# Patient Record
Sex: Female | Born: 1980 | Race: White | Hispanic: No | Marital: Married | Smoking: Never smoker
Health system: Southern US, Community
[De-identification: ages and names within clinical notes are randomized; demographics above are authoritative.]

## PROBLEM LIST (undated history)

## (undated) DIAGNOSIS — I480 Paroxysmal atrial fibrillation: Secondary | ICD-10-CM

## (undated) DIAGNOSIS — Z9889 Other specified postprocedural states: Secondary | ICD-10-CM

## (undated) DIAGNOSIS — I2489 Other forms of acute ischemic heart disease: Secondary | ICD-10-CM

## (undated) DIAGNOSIS — F32A Depression, unspecified: Secondary | ICD-10-CM

## (undated) DIAGNOSIS — G4730 Sleep apnea, unspecified: Secondary | ICD-10-CM

## (undated) DIAGNOSIS — I4891 Unspecified atrial fibrillation: Secondary | ICD-10-CM

## (undated) DIAGNOSIS — Z9289 Personal history of other medical treatment: Secondary | ICD-10-CM

## (undated) DIAGNOSIS — I10 Essential (primary) hypertension: Secondary | ICD-10-CM

## (undated) DIAGNOSIS — F329 Major depressive disorder, single episode, unspecified: Secondary | ICD-10-CM

## (undated) DIAGNOSIS — F419 Anxiety disorder, unspecified: Secondary | ICD-10-CM

## (undated) DIAGNOSIS — R112 Nausea with vomiting, unspecified: Secondary | ICD-10-CM

## (undated) HISTORY — DX: Other forms of acute ischemic heart disease: I24.89

## (undated) HISTORY — DX: Paroxysmal atrial fibrillation: I48.0

## (undated) HISTORY — DX: Morbid (severe) obesity due to excess calories: E66.01

## (undated) HISTORY — DX: Sleep apnea, unspecified: G47.30

## (undated) HISTORY — DX: Personal history of other medical treatment: Z92.89

---

## 2000-05-12 ENCOUNTER — Encounter: Payer: Self-pay | Admitting: Family Medicine

## 2000-05-12 ENCOUNTER — Encounter: Admission: RE | Admit: 2000-05-12 | Discharge: 2000-05-12 | Payer: Self-pay | Admitting: Family Medicine

## 2006-12-17 ENCOUNTER — Emergency Department: Payer: Self-pay | Admitting: Emergency Medicine

## 2007-02-25 ENCOUNTER — Inpatient Hospital Stay: Payer: Self-pay | Admitting: Obstetrics and Gynecology

## 2011-03-26 ENCOUNTER — Ambulatory Visit: Payer: Self-pay | Admitting: Obstetrics and Gynecology

## 2011-11-12 DIAGNOSIS — N970 Female infertility associated with anovulation: Secondary | ICD-10-CM | POA: Insufficient documentation

## 2014-01-08 ENCOUNTER — Encounter: Payer: Self-pay | Admitting: Maternal & Fetal Medicine

## 2014-07-19 ENCOUNTER — Ambulatory Visit: Payer: Self-pay | Admitting: Obstetrics and Gynecology

## 2014-07-19 LAB — CBC WITH DIFFERENTIAL/PLATELET
Basophil #: 0 10*3/uL (ref 0.0–0.1)
Basophil %: 0.1 %
Eosinophil #: 0.1 10*3/uL (ref 0.0–0.7)
Eosinophil %: 0.5 %
HCT: 38.5 % (ref 35.0–47.0)
HGB: 12.6 g/dL (ref 12.0–16.0)
Lymphocyte #: 1.5 10*3/uL (ref 1.0–3.6)
Lymphocyte %: 12.1 %
MCH: 31.1 pg (ref 26.0–34.0)
MCHC: 32.8 g/dL (ref 32.0–36.0)
MCV: 95 fL (ref 80–100)
Monocyte #: 1.2 x10 3/mm — ABNORMAL HIGH (ref 0.2–0.9)
Monocyte %: 9.6 %
Neutrophil #: 9.9 10*3/uL — ABNORMAL HIGH (ref 1.4–6.5)
Neutrophil %: 77.7 %
Platelet: 163 10*3/uL (ref 150–440)
RBC: 4.06 10*6/uL (ref 3.80–5.20)
RDW: 13.7 % (ref 11.5–14.5)
WBC: 12.8 10*3/uL — ABNORMAL HIGH (ref 3.6–11.0)

## 2014-07-20 ENCOUNTER — Inpatient Hospital Stay: Payer: Self-pay | Admitting: Obstetrics and Gynecology

## 2014-07-21 LAB — HEMATOCRIT: HCT: 33.3 % — ABNORMAL LOW (ref 35.0–47.0)

## 2014-07-23 LAB — COMPREHENSIVE METABOLIC PANEL
Albumin: 2.3 g/dL — ABNORMAL LOW (ref 3.4–5.0)
Alkaline Phosphatase: 99 U/L
Anion Gap: 9 (ref 7–16)
BUN: 6 mg/dL — ABNORMAL LOW (ref 7–18)
Bilirubin,Total: 0.2 mg/dL (ref 0.2–1.0)
Calcium, Total: 8.4 mg/dL — ABNORMAL LOW (ref 8.5–10.1)
Chloride: 109 mmol/L — ABNORMAL HIGH (ref 98–107)
Co2: 24 mmol/L (ref 21–32)
Creatinine: 0.62 mg/dL (ref 0.60–1.30)
EGFR (African American): >60
EGFR (Non-African Amer.): >60
Glucose: 148 mg/dL — ABNORMAL HIGH (ref 65–99)
Osmolality: 283 (ref 275–301)
Potassium: 3.6 mmol/L (ref 3.5–5.1)
SGOT(AST): 38 U/L — ABNORMAL HIGH (ref 15–37)
SGPT (ALT): 26 U/L
Sodium: 142 mmol/L (ref 136–145)
Total Protein: 6.4 g/dL (ref 6.4–8.2)

## 2014-07-23 LAB — CBC WITH DIFFERENTIAL/PLATELET
Basophil #: 0 x10 3/mm 3
Basophil %: 0.1 %
Eosinophil #: 0 x10 3/mm 3
Eosinophil %: 0.3 %
HCT: 36.9 %
HGB: 12 g/dL
Lymphocyte %: 6.7 %
Lymphs Abs: 0.9 x10 3/mm 3 — ABNORMAL LOW
MCH: 31.1 pg
MCHC: 32.5 g/dL
MCV: 96 fL
Monocyte #: 0.5 "x10 3/mm "
Monocyte %: 3.6 %
Neutrophil #: 11.4 x10 3/mm 3 — ABNORMAL HIGH
Neutrophil %: 89.3 %
Platelet: 173 x10 3/mm 3
RBC: 3.86 X10 6/mm 3
RDW: 14 %
WBC: 12.8 x10 3/mm 3 — ABNORMAL HIGH

## 2014-07-23 LAB — TSH: Thyroid Stimulating Horm: 1.63 u[IU]/mL

## 2014-07-23 LAB — URIC ACID: Uric Acid: 3.2 mg/dL

## 2014-07-23 LAB — FOLATE: Folic Acid: 60.6 ng/mL — ABNORMAL HIGH

## 2014-11-04 NOTE — Op Note (Signed)
PATIENT NAME:  Kelly [name], Kelly [name] MR#:  [id number] DATE OF BIRTH:  Jan 24, 1981  DATE OF PROCEDURE:  07/20/2014  PREOPERATIVE DIAGNOSIS:  1. Elective repeat cesarean section. 2. 39 +4 weeks estimated gestational age.   POSTOPERATIVE DIAGNOSES: 1. Elective repeat cesarean section. 2. 39 +4 weeks estimated gestational age.   PROCEDURES PERFORMED: 1. Elective repeat low transverse cesarean section.  2. On-Q pump placement.   ANESTHESIA: Spinal.   SURGEON: Kelly Bouchardhomas J Faven Watterson, MD   FIRST ASSISTANT: Kelly Iraniavid M. Derrill KayGoodman, MD   INDICATION FOR PROCEDURE: This is a 34 year old gravida 3, para 1, a patient with a previous C-section. EDC of 07/23/2014. The patient elects for elective repeat cesarean section.   PROCEDURE IN DETAIL: After adequate spinal anesthesia, the patient was prepped and draped in normal sterile fashion. A Pfannenstiel incision was made 2 fingerbreadths above the symphysis pubis. Sharp dissection was used to identify the fascia. The fascia was opened in the midline and opened in a transverse fashion. The superior aspect of the fascia was grasped with Kocher clamps and the recti muscles dissected free. The inferior aspect of the fascia was grasped with Kocher clamps. Pyramidalis muscle dissected free. Entry into the peritoneal cavity was accomplished sharply. The vesicouterine peritoneal fold was not identified. Therefore, a direct low transverse uterine incision was made. Upon entry into the endometrial cavity, clear fluid resulted. Excision was extended with blunt transverse traction. The fetal head was brought to the incision, and head was delivered without difficulty. Loose nuchal cord was reduced, and the shoulders and body were delivered. A vigorous female was passed to nursery staff to assign Apgar scores of 8 and 9. The placenta was manually delivered and the uterus was exteriorized. The endometrial cavity was wiped clean with laparotomy tape. Cervix was opened with ring forceps and  passed off the operative field. The uterine incision was closed with 1 chromic suture in a running nonlocking fashion with good approximation of edges. Good hemostasis was noted. One additional figure-of-eight suture was required for hemostasis. Fallopian tubes and ovaries appeared normal. Slight fimbrial adhesion to the right ovary, which was not clinically significant. The posterior cul-de-sac was irrigated and suctioned, and the uterus was placed back into the abdominal cavity. Given the initial tone of the uterus, the patient did receive 0.2 mg of Methergine while she was receiving intravenous Pitocin. The uterine incision again appeared hemostatic. The paracolic gutters were wiped clean with laparotomy tape and Interceed was placed over the uterine incision in a T-shaped fashion. The superior aspect of the fascia was grasped with Coker clamps and the On-Q pump catheters were advanced from an infraumbilical position to a subfascial position. The fascia was then closed over top of these with 0 Vicryl suture in a running nonlocking fashion with good approximation of edges. Good hemostasis was noted. Subcutaneous tissues were irrigated and bovied for hemostasis. Depth of the subcutaneous tissues approximately 3 cm; therefore, dead space was closed with a running 2-0 chromic suture. The skin was reapproximated with staples. The On-Q pump catheters were secured at the skin level with Dermabond and Steri-Stripped to the skin, and a Tegaderm was placed over top of these. Each catheter was loaded with 5 mL of 0.5% Marcaine.    COMPLICATIONS: None.   ESTIMATED BLOOD LOSS: 500 mL.   INTRAOPERATIVE FLUIDS: 600 mL.   The patient did receive 3 grams of Ancef prior to commencement of the case. The patient was taken to the recovery room in good condition.    ____________________________ Kelly Fushomas  Cloyde Reams, MD tjs:mw [name]: 07/20/2014 08:36:37 ET T: 07/20/2014 11:26:37 ET JOB#: [id number]  cc: Kelly Bouchard, MD, <Dictator> Kelly Bouchard MD ELECTRONICALLY SIGNED 07/23/2014 9:22

## 2014-11-04 NOTE — Consult Note (Signed)
Referring Physician:  Boykin Nearing   Primary Care Physician:   [facility], [address] [place], [place] [address], [phone]  Reason for Consult: Admit Date: 20-Jul-2014  Chief Complaint: inability to move  Reason for Consult: diffuse weakness   History of Present Illness: History of Present Illness:   34 yo RHD F presents to [facility] secondary to c-section for her second child.  Pt was been here for three days which have been uneventful after c-section.  Today around noon, she developed diffuse weakness in her legs and in bilateral arms as well.  She denies headache.  She denies vision changes, speech problems or neck pain.  She did have some mild back pain.  She feels hot and felt like she was going to pass out.  She felt extremely anxious and some chest pain as well.  ROS:  General weakness    HEENT no complaints    Lungs no complaints    Cardiac chest pain    GI nausea    GU no complaints    Musculoskeletal back pain    Extremities no complaints    Skin no complaints    Neuro no complaints    Endocrine no complaints    Psych anxiety    Past Medical/ Surgical Hx:  Past Medical History none   Past Surgical History none   Home Medications: Medication Instructions Last Modified Date/Time  ibuprofen 600 mg oral tablet 1 tab(s) orally every 6 hours, As needed, mild pain (1-3/10) 18-Jan-16 08:49  acetaminophen-HYDROcodone 325 mg-5 mg oral tablet 1 tab(s) orally every 3 hours, As needed, breakthrough severe pain (7-10/10) 18-Jan-16 08:49  loratadine 10 mg oral tablet 1 tab(s) orally once a day 18-Jan-16 08:48  docusate sodium 100 mg oral capsule 1 cap(s) orally 2 times a day 18-Jan-16 08:49  Prenatal Multivitamins with Folic Acid 1 mg oral tablet 1 tab(s) orally once a day 18-Jan-16 08:48  Claritin 10 mg oral tablet 1 tab(s) orally once a day 14-Jan-16 10:18   Allergies:  No Known Allergies:   Allergies:   Allergies NKDA    Social/Family History: Employment Status: currently employed  Lives With: spouse  Living Arrangements: house  Social History: no tob, no EtOh, no illicits  Family History: no seizures, no stroke   Vital Signs: **Vital Signs.:   18-Jan-16 11:48  Vital Signs Type Recheck  Temperature Temperature (F) 98.2  Celsius 36.7  Temperature Source oral  Pulse Pulse 109  Respirations Respirations 18  Systolic BP Systolic BP 267  Diastolic BP (mmHg) Diastolic BP (mmHg) 61  Mean BP 89  Pulse Ox % Pulse Ox % 100  Pulse Ox Activity Level  At rest; on L side  Oxygen Delivery Room Air/ 21 %   Physical Exam: General: obese, moderate distress, anxious and tearful  HEENT: normocephalic, sclera nonicteric, oropharynx clear  Neck: supple, no JVD, no bruits  Chest: CTA B, no wheezing  Cardiac: RRR, no murmurs, no edema, 2+ pulses  Extremities: no C/C/E, FROM   Neurologic Exam: Mental Status: alert and oriented x 3, normal speech and language, follows complex commands  Cranial Nerves: PERRLA, EOMI, nl VF, face symmetric, tongue midline, shoulder shrug equal  Motor Exam: 4/5 B but reports that L is weaker,  there is poor effort throughout exam, neg Hoover, nl tone, no atrophy  Deep Tendon Reflexes: 3+/4 B, mild Hoffman on R, mute plantars B  Sensory Exam: pinprick, temperature, and vibration intact B  Coordination: not tested   Lab Results:  Routine Hem:  16-Jan-16 05:10   Hematocrit (CBC)  33.3 (Result(s) reported on 21 Jul 2014 at [facility]   Impression/Recommendations: Recommendations:   prior notes reviewed by me reviewed by me   Diffuse weakness-  this does not appear to have a neurologic origin as it does not follow typical pathways and mechanisms.  Cervical disease could cause diffuse weakness but we would expect numbness and pain.  Doubt spinal stroke with hand involvement.  Not GBS with reflexes.  Doubt cerebral thromobosis with no headache.  This seems to be more  of a panic attack to me. Low back pain-  slightly symptomatic after spinal STAT MRI of brain, c-spine and l-spine check B12/folate, ESR, TSH basic labs pending not tPA candidate due to recent surgery and doubt stroke anyhow will sign out to on-call neurologist  Electronic Signatures: Jamison Neighbor (MD)  (Signed 18-Jan-16 12:59)  Authored: REFERRING PHYSICIAN, Primary Care Physician, Consult, History of Present Illness, Review of Systems, PAST MEDICAL/SURGICAL HISTORY, HOME MEDICATIONS, ALLERGIES, Social/Family History, NURSING VITAL SIGNS, Physical Exam-, LAB RESULTS, Recommendations   Last Updated: 18-Jan-16 12:59 by Jamison Neighbor (MD)

## 2015-01-11 ENCOUNTER — Observation Stay: Payer: BLUE CROSS/BLUE SHIELD | Admitting: Anesthesiology

## 2015-01-11 ENCOUNTER — Emergency Department: Payer: BLUE CROSS/BLUE SHIELD

## 2015-01-11 ENCOUNTER — Observation Stay
Admission: EM | Admit: 2015-01-11 | Discharge: 2015-01-14 | Disposition: A | Discharge status: Home / Self Care | Payer: BLUE CROSS/BLUE SHIELD | Attending: Surgery | Admitting: Surgery

## 2015-01-11 ENCOUNTER — Encounter
Admission: EM | Disposition: A | Discharge status: Home / Self Care | Payer: Self-pay | Source: Home / Self Care | Attending: Emergency Medicine

## 2015-01-11 ENCOUNTER — Observation Stay: Payer: BLUE CROSS/BLUE SHIELD

## 2015-01-11 ENCOUNTER — Encounter: Payer: Self-pay | Admitting: Emergency Medicine

## 2015-01-11 DIAGNOSIS — K810 Acute cholecystitis: Secondary | ICD-10-CM | POA: Diagnosis present

## 2015-01-11 DIAGNOSIS — Z419 Encounter for procedure for purposes other than remedying health state, unspecified: Secondary | ICD-10-CM

## 2015-01-11 DIAGNOSIS — K8010 Calculus of gallbladder with chronic cholecystitis without obstruction: Secondary | ICD-10-CM | POA: Diagnosis not present

## 2015-01-11 DIAGNOSIS — R52 Pain, unspecified: Secondary | ICD-10-CM

## 2015-01-11 DIAGNOSIS — K8020 Calculus of gallbladder without cholecystitis without obstruction: Secondary | ICD-10-CM | POA: Insufficient documentation

## 2015-01-11 DIAGNOSIS — R079 Chest pain, unspecified: Secondary | ICD-10-CM | POA: Diagnosis not present

## 2015-01-11 DIAGNOSIS — R1011 Right upper quadrant pain: Secondary | ICD-10-CM | POA: Insufficient documentation

## 2015-01-11 DIAGNOSIS — K8012 Calculus of gallbladder with acute and chronic cholecystitis without obstruction: Secondary | ICD-10-CM

## 2015-01-11 DIAGNOSIS — K8080 Other cholelithiasis without obstruction: Secondary | ICD-10-CM | POA: Diagnosis not present

## 2015-01-11 DIAGNOSIS — R42 Dizziness and giddiness: Secondary | ICD-10-CM | POA: Insufficient documentation

## 2015-01-11 HISTORY — DX: Anxiety disorder, unspecified: F41.9

## 2015-01-11 HISTORY — PX: CHOLECYSTECTOMY: SHX55

## 2015-01-11 HISTORY — DX: Depression, unspecified: F32.A

## 2015-01-11 HISTORY — DX: Nausea with vomiting, unspecified: R11.2

## 2015-01-11 HISTORY — DX: Major depressive disorder, single episode, unspecified: F32.9

## 2015-01-11 HISTORY — DX: Other specified postprocedural states: Z98.890

## 2015-01-11 LAB — CBC
HCT: 41.4 % (ref 35.0–47.0)
Hemoglobin: 13.7 g/dL (ref 12.0–16.0)
MCH: 29.8 pg (ref 26.0–34.0)
MCHC: 33.1 g/dL (ref 32.0–36.0)
MCV: 90 fL (ref 80.0–100.0)
Platelets: 162 10*3/uL (ref 150–440)
RBC: 4.59 MIL/uL (ref 3.80–5.20)
RDW: 13 % (ref 11.5–14.5)
WBC: 8.2 10*3/uL (ref 3.6–11.0)

## 2015-01-11 LAB — BASIC METABOLIC PANEL
ANION GAP: 10 (ref 5–15)
BUN: 13 mg/dL (ref 6–20)
CHLORIDE: 107 mmol/L (ref 101–111)
CO2: 25 mmol/L (ref 22–32)
Calcium: 9.2 mg/dL (ref 8.9–10.3)
Creatinine, Ser: 0.55 mg/dL (ref 0.44–1.00)
GFR calc Af Amer: >60 mL/min (ref >60)
GFR calc non Af Amer: >60 mL/min (ref >60)
GLUCOSE: 113 mg/dL — AB (ref 65–99)
POTASSIUM: 3.2 mmol/L — AB (ref 3.5–5.1)
SODIUM: 142 mmol/L (ref 135–145)

## 2015-01-11 LAB — POCT PREGNANCY, URINE: Preg Test, Ur: NEGATIVE

## 2015-01-11 LAB — TROPONIN I

## 2015-01-11 LAB — LIPASE, BLOOD: LIPASE: 43 U/L (ref 22–51)

## 2015-01-11 LAB — HEPATIC FUNCTION PANEL
ALT: 22 U/L (ref 14–54)
AST: 31 U/L (ref 15–41)
Albumin: 4.1 g/dL (ref 3.5–5.0)
Alkaline Phosphatase: 130 U/L — ABNORMAL HIGH (ref 38–126)
Bilirubin, Direct: <0.1 mg/dL — ABNORMAL LOW (ref 0.1–0.5)
Total Bilirubin: 0.3 mg/dL (ref 0.3–1.2)
Total Protein: 7.3 g/dL (ref 6.5–8.1)

## 2015-01-11 SURGERY — LAPAROSCOPIC CHOLECYSTECTOMY WITH INTRAOPERATIVE CHOLANGIOGRAM
Anesthesia: General | Wound class: Clean Contaminated

## 2015-01-11 MED ORDER — LORATADINE 10 MG PO TABS
10.0000 mg | ORAL_TABLET | Freq: Every day | ORAL | Status: DC
  Administered 2015-01-11 – 2015-01-13 (×4): 10 mg via ORAL
  Filled 2015-01-11 (×4): qty 1

## 2015-01-11 MED ORDER — LACTATED RINGERS IV SOLN
INTRAVENOUS | Status: DC | PRN
  Administered 2015-01-11: 12:00:00 via INTRAVENOUS

## 2015-01-11 MED ORDER — MORPHINE SULFATE 2 MG/ML IJ SOLN
2.0000 mg | INTRAMUSCULAR | Status: DC | PRN
  Administered 2015-01-11 (×2): 4 mg via INTRAVENOUS
  Administered 2015-01-12: 2 mg via INTRAVENOUS
  Administered 2015-01-12: 4 mg via INTRAVENOUS
  Filled 2015-01-11 (×4): qty 2

## 2015-01-11 MED ORDER — PROPOFOL 10 MG/ML IV BOLUS
INTRAVENOUS | Status: DC | PRN
  Administered 2015-01-11: 150 mg via INTRAVENOUS

## 2015-01-11 MED ORDER — LIDOCAINE HCL (CARDIAC) 20 MG/ML IV SOLN
INTRAVENOUS | Status: DC | PRN
  Administered 2015-01-11: 80 mg via INTRAVENOUS

## 2015-01-11 MED ORDER — DEXAMETHASONE SODIUM PHOSPHATE 4 MG/ML IJ SOLN
INTRAMUSCULAR | Status: DC | PRN
  Administered 2015-01-11: 10 mg via INTRAVENOUS

## 2015-01-11 MED ORDER — GI COCKTAIL ~~LOC~~
30.0000 mL | Freq: Once | ORAL | Status: AC
  Administered 2015-01-11: 30 mL via ORAL

## 2015-01-11 MED ORDER — ACETAMINOPHEN 650 MG RE SUPP: 650.0000 mg | Freq: Four times a day (QID) | RECTAL | Status: DC | PRN

## 2015-01-11 MED ORDER — GI COCKTAIL ~~LOC~~
ORAL | Status: AC
  Administered 2015-01-11: 30 mL via ORAL
  Filled 2015-01-11: qty 30

## 2015-01-11 MED ORDER — ONDANSETRON HCL 4 MG/2ML IJ SOLN
4.0000 mg | Freq: Four times a day (QID) | INTRAMUSCULAR | Status: DC | PRN
  Administered 2015-01-11: 4 mg via INTRAVENOUS

## 2015-01-11 MED ORDER — ENOXAPARIN SODIUM 40 MG/0.4ML ~~LOC~~ SOLN
40.0000 mg | SUBCUTANEOUS | Status: DC
  Administered 2015-01-11 – 2015-01-13 (×3): 40 mg via SUBCUTANEOUS
  Filled 2015-01-11 (×3): qty 0.4

## 2015-01-11 MED ORDER — KETOROLAC TROMETHAMINE 30 MG/ML IJ SOLN
INTRAMUSCULAR | Status: DC | PRN
  Administered 2015-01-11: 30 mg via INTRAVENOUS

## 2015-01-11 MED ORDER — PANTOPRAZOLE SODIUM 40 MG IV SOLR
40.0000 mg | Freq: Every day | INTRAVENOUS | Status: DC
  Administered 2015-01-11: 40 mg via INTRAVENOUS
  Filled 2015-01-11: qty 40

## 2015-01-11 MED ORDER — SERTRALINE HCL 50 MG PO TABS
50.0000 mg | ORAL_TABLET | Freq: Every day | ORAL | Status: DC
  Administered 2015-01-11 – 2015-01-13 (×3): 50 mg via ORAL
  Filled 2015-01-11 (×4): qty 1

## 2015-01-11 MED ORDER — CEFAZOLIN SODIUM-DEXTROSE 2-3 GM-% IV SOLR
2.0000 g | Freq: Once | INTRAVENOUS | Status: AC
  Administered 2015-01-11: 2 g via INTRAVENOUS
  Filled 2015-01-11 (×2): qty 50

## 2015-01-11 MED ORDER — HYDROCODONE-ACETAMINOPHEN 5-325 MG PO TABS
1.0000 | ORAL_TABLET | ORAL | Status: DC | PRN
  Administered 2015-01-11 – 2015-01-12 (×4): 1 via ORAL
  Administered 2015-01-12: 2 via ORAL
  Administered 2015-01-12 – 2015-01-14 (×9): 1 via ORAL
  Filled 2015-01-11 (×6): qty 1
  Filled 2015-01-11: qty 2
  Filled 2015-01-11 (×8): qty 1

## 2015-01-11 MED ORDER — FENTANYL CITRATE (PF) 100 MCG/2ML IJ SOLN
INTRAMUSCULAR | Status: DC | PRN
  Administered 2015-01-11 (×3): 50 ug via INTRAVENOUS

## 2015-01-11 MED ORDER — SODIUM CHLORIDE 0.9 % IV SOLN
INTRAVENOUS | Status: DC
  Administered 2015-01-11 (×3): via INTRAVENOUS
  Administered 2015-01-12: 1000 mL via INTRAVENOUS
  Administered 2015-01-12 – 2015-01-13 (×2): via INTRAVENOUS
  Administered 2015-01-13: 1000 mL via INTRAVENOUS
  Administered 2015-01-14: 01:00:00 via INTRAVENOUS

## 2015-01-11 MED ORDER — MIDAZOLAM HCL 2 MG/2ML IJ SOLN
INTRAMUSCULAR | Status: DC | PRN
  Administered 2015-01-11: 2 mg via INTRAVENOUS

## 2015-01-11 MED ORDER — HYDROMORPHONE HCL 1 MG/ML IJ SOLN
0.2500 mg | INTRAMUSCULAR | Status: DC | PRN
  Administered 2015-01-11 (×8): 0.25 mg via INTRAVENOUS

## 2015-01-11 MED ORDER — HYDROCODONE-ACETAMINOPHEN 5-325 MG PO TABS
1.0000 | ORAL_TABLET | Freq: Once | ORAL | Status: AC
  Administered 2015-01-11: 1 via ORAL

## 2015-01-11 MED ORDER — NEOSTIGMINE METHYLSULFATE 10 MG/10ML IV SOLN
INTRAVENOUS | Status: DC | PRN
  Administered 2015-01-11: 5 mg via INTRAVENOUS

## 2015-01-11 MED ORDER — SODIUM CHLORIDE 0.9 % IV SOLN
INTRAVENOUS | Status: DC | PRN
  Administered 2015-01-11: 5 mL

## 2015-01-11 MED ORDER — FENTANYL CITRATE (PF) 100 MCG/2ML IJ SOLN
25.0000 ug | INTRAMUSCULAR | Status: DC | PRN
  Administered 2015-01-11 (×4): 25 ug via INTRAVENOUS

## 2015-01-11 MED ORDER — ROCURONIUM BROMIDE 100 MG/10ML IV SOLN
INTRAVENOUS | Status: DC | PRN
  Administered 2015-01-11: 40 mg via INTRAVENOUS
  Administered 2015-01-11: 10 mg via INTRAVENOUS

## 2015-01-11 MED ORDER — HEPARIN SODIUM (PORCINE) 5000 UNIT/ML IJ SOLN
INTRAMUSCULAR | Status: DC | PRN
  Administered 2015-01-11: 5000 [IU] via SUBCUTANEOUS

## 2015-01-11 MED ORDER — BUPIVACAINE HCL 0.25 % IJ SOLN
INTRAMUSCULAR | Status: DC | PRN
  Administered 2015-01-11: 30 mL

## 2015-01-11 MED ORDER — ONDANSETRON HCL 4 MG/2ML IJ SOLN: 4.0000 mg | Freq: Once | INTRAMUSCULAR | Status: AC | PRN

## 2015-01-11 MED ORDER — GLYCOPYRROLATE 0.2 MG/ML IJ SOLN
INTRAMUSCULAR | Status: DC | PRN
  Administered 2015-01-11: .8 mg via INTRAVENOUS

## 2015-01-11 MED ORDER — ACETAMINOPHEN 325 MG PO TABS: 650.0000 mg | ORAL_TABLET | Freq: Four times a day (QID) | ORAL | Status: DC | PRN

## 2015-01-11 MED ORDER — HYDROCODONE-ACETAMINOPHEN 5-325 MG PO TABS
ORAL_TABLET | ORAL | Status: AC
  Administered 2015-01-11: 1 via ORAL
  Filled 2015-01-11: qty 1

## 2015-01-11 SURGICAL SUPPLY — 45 items
APPLIER CLIP ROT 10 11.4 M/L (STAPLE) ×3
APR CLP MED LRG 11.4X10 (STAPLE) ×1
BAG COUNTER SPONGE EZ (MISCELLANEOUS) IMPLANT
BAG SPNG 4X4 CLR HAZ (MISCELLANEOUS)
CANISTER SUCT 1200ML W/VALVE (MISCELLANEOUS) ×3 IMPLANT
CATH REDDICK CHOLANGI 4FR 50CM (CATHETERS) ×3 IMPLANT
CHLORAPREP W/TINT 26ML (MISCELLANEOUS) ×3 IMPLANT
CLIP APPLIE ROT 10 11.4 M/L (STAPLE) ×1 IMPLANT
CONRAY 60ML FOR OR (MISCELLANEOUS) ×3 IMPLANT
COUNTER SPONGE BAG EZ (MISCELLANEOUS)
DRAPE SHEET LG 3/4 BI-LAMINATE (DRAPES) ×3 IMPLANT
DRESSING TELFA 4X3 1S ST N-ADH (GAUZE/BANDAGES/DRESSINGS) ×3 IMPLANT
DRSG TEGADERM 2-3/8X2-3/4 SM (GAUZE/BANDAGES/DRESSINGS) ×3 IMPLANT
DRSG TELFA 3X8 NADH (GAUZE/BANDAGES/DRESSINGS) ×3 IMPLANT
GLOVE BIO SURGEON STRL SZ7.5 (GLOVE) ×3 IMPLANT
GLOVE INDICATOR 8.0 STRL GRN (GLOVE) ×3 IMPLANT
GOWN STRL REUS W/ TWL LRG LVL3 (GOWN DISPOSABLE) ×3 IMPLANT
GOWN STRL REUS W/TWL LRG LVL3 (GOWN DISPOSABLE) ×6
GRASPER SUT TROCAR 14GX15 (MISCELLANEOUS) ×3 IMPLANT
IRRIGATION STRYKERFLOW (MISCELLANEOUS) ×1 IMPLANT
IRRIGATOR STRYKERFLOW (MISCELLANEOUS) ×3
IV NS 1000ML (IV SOLUTION) ×2
IV NS 1000ML BAXH (IV SOLUTION) ×1 IMPLANT
LABEL OR SOLS (LABEL) ×3 IMPLANT
NDL SAFETY 18GX1.5 (NEEDLE) ×3 IMPLANT
NDL SAFETY 25GX1.5 (NEEDLE) ×3 IMPLANT
NEEDLE 18GX1X1/2 (RX/OR ONLY) (NEEDLE) ×3 IMPLANT
NEEDLE HYPO 25X1 1.5 SAFETY (NEEDLE) ×3 IMPLANT
NEEDLE INSUFFLATION 14GA 120MM (NEEDLE) ×3 IMPLANT
NS IRRIG 500ML POUR BTL (IV SOLUTION) ×3 IMPLANT
PACK LAP CHOLECYSTECTOMY (MISCELLANEOUS) ×3 IMPLANT
PAD GROUND ADULT SPLIT (MISCELLANEOUS) ×3 IMPLANT
POUCH ENDO CATCH 10MM SPEC (MISCELLANEOUS) ×3 IMPLANT
SCISSORS METZENBAUM CVD 33 (INSTRUMENTS) ×3 IMPLANT
SEAL FOR SCOPE WARMER C3101 (MISCELLANEOUS) IMPLANT
SLEEVE ADV FIXATION 5X100MM (TROCAR) ×3 IMPLANT
STRAP SAFETY BODY (MISCELLANEOUS) ×3 IMPLANT
SUT ETHILON 5-0 FS-2 18 BLK (SUTURE) ×3 IMPLANT
SUT VIC AB 0 CT2 27 (SUTURE) ×3 IMPLANT
SYR 3ML LL SCALE MARK (SYRINGE) ×3 IMPLANT
TROCAR Z-THREAD FIOS 11X100 BL (TROCAR) ×3 IMPLANT
TROCAR Z-THREAD OPTICAL 5X100M (TROCAR) ×3 IMPLANT
TROCAR Z-THREAD SLEEVE 11X100 (TROCAR) ×3 IMPLANT
TUBING INSUFFLATOR HI FLOW (MISCELLANEOUS) ×3 IMPLANT
WATER STERILE IRR 1000ML POUR (IV SOLUTION) IMPLANT

## 2015-01-11 NOTE — ED Notes (Signed)
Pt to ED c/o mid chest burning that radiates to her back, woke her at 0230. Pt reports taking PO Zantac at home with minimal relief.

## 2015-01-11 NOTE — Op Note (Signed)
01/11/2015  1:21 PM  PATIENT:  Kelly [name]  34 y.o. female  PRE-OPERATIVE DIAGNOSIS:  cholelithiasis  POST-OPERATIVE DIAGNOSIS:  cholelithiasis  PROCEDURE:  Procedure(s): LAPAROSCOPIC CHOLECYSTECTOMY WITH INTRAOPERATIVE CHOLANGIOGRAM (N/A)  SURGEON:  Surgeon(s) and Role:    * Salley Hewsalph Ely III, MD - Primary   ASSISTANTS: none   ANESTHESIA:   general  EBL:      DRAINS: none   LOCAL MEDICATIONS USED:  MARCAINE      DISPOSITION OF SPECIMEN:  PATHOLOGY   DICTATION: .Dragon Dictation  With the patient supine position after induction of appropriate general anesthesia the patient was prepped with ChloraPrep and draped sterile towels. The patient was placed in headdown feet up position. Small infraumbilical incision was made standard fashion carried down bluntly to the subcutaneous tissue. A varies needle was used to cannulate peritoneal cavity. CO2 was insufflated to appropriate pressure measurements. When approximately 2 L of CO2 were instilled a varies needle was withdrawn and an 11 mm port placed in the umbilical incision. Intraperitoneal position was confirmed and CO2 was reinsufflated.  The patient was placed head up feet down position rotated slightly to the left side. Subxiphoid transverse incision was made 11 mm port inserted under direct vision. 2 lateral ports 5 mm in size inserted under direct vision. Gallbladder was injected inflamed and edematous appear to be significantly distended. It was aspirated approximately 50 cc of dark colored bile. The gallbladder retracted superiorly and laterally exposing the hepatoduodenal ligament. Cystic artery and cystic duct were identified. The cystic duct was doubly clipped and divided first then the gallbladder was dissected bit further. The cystic duct the present gallbladder junction opened. An on table clench gram was performed using dynamic fluoroscopy. There was very slow flow of dye into the duodenum but it didn't appear patent. No  stone was identified. Intrahepatic radicals were seen. The catheter was withdrawn and the cystic duct doubly clipped on the common duct side and divided. The graft the gallbladder was then dissected free from its bed in the V silk and cautery apparatus. Once the gallbladder was free it was captured in an Endo Catch apparatus removed through the subxiphoid incision.  The abdomen was then copiously irrigated. The upper midline incision was closed with figure-of-eight suture using 0 Vicryl and the suture passer. The abdomen was then desufflated. All ports withdrawn without difficulty. Skin incisions were closed with 5-0 nylon. The area was infiltrated with 0.25% Marcaine for postoperative pain control. Sterile dressings were applied. Patient returned recovery room having tolerated procedure well. Sponge instrument needle count correct 2 in the operative.  PLAN OF CARE: Admit to inpatient   PATIENT DISPOSITION:  PACU - hemodynamically stable.   Tiney Rougealph Ely III, MD

## 2015-01-11 NOTE — H&P (Signed)
CC: Epigastric pain x 4 hours, nausea  HPI: 34 yo F presents with 4 hours of epigastric and RUQ pain, nausea.  Began acutely and awoke her.  Different from normal reflux pain.  Radiates to back.  Better with pain meds.  Some nausea without vomiting.  No fevers/chills, night sweats, shortness of breath, cough, diarrhea/constipation, dysuria/hematuria.    Active Ambulatory Problems    Diagnosis Date Noted  . No Active Ambulatory Problems   Resolved Ambulatory Problems    Diagnosis Date Noted  . No Resolved Ambulatory Problems   No Additional Past Medical History   No Known Allergies     Medication List    ASK your doctor about these medications        FENUGREEK PO  Take by mouth daily.     loratadine 10 MG tablet  Commonly known as:  CLARITIN  Take 10 mg by mouth daily.     multivitamin tablet  Take 1 tablet by mouth daily.     sertraline 50 MG tablet  Commonly known as:  ZOLOFT  Take 50 mg by mouth daily.       History   Social History  . Marital Status: Married    Spouse Name: N/A  . Number of Children: N/A  . Years of Education: N/A   Occupational History  . Not on file.   Social History Main Topics  . Smoking status: Never Smoker   . Smokeless tobacco: Never Used  . Alcohol Use: No  . Drug Use: No  . Sexual Activity: Not on file   Other Topics Concern  . Not on file   Social History Narrative  . No narrative on file   Family history: + for diabetes, HTN, cancer, heart disease  ROS: Full ROS obtained, pertinent positives and negatives as above  Blood pressure 140/78, pulse 99, temperature 97.5 F (36.4 C), temperature source Oral, resp. rate 16, height 5\' 10"  (1.778 m), weight 97.523 kg (215 lb), last menstrual period 10/18/2013, SpO2 98 %. GEN: NAD/A&Ox3 FACE: no obvious facial trauma, normal external nose, normal external ears EYES: no scleral icterus, no conjunctivitis HEAD: normocephalic atraumatic CV: RRR, no MRG RESP: moving air well,  lungs clear ABD: soft, nontender, nondistended EXT: moving all ext well, strength 5/5 NEURO: cnII-XII grossly intact, sensation intact all 4 ext  Labs: All labs reviewed,  Significant for WBC 8.2  U/S: Reviewed, gallstones with nonmobile stone in neck of gallbladder  A/P 34 yo F with epigastric pain, nonmobile stone in neck of gallbladder.  I have recommended cholecystectomy.  I have described its benefits and risks including a 1:200 reported risk of injury to the common bile duct.  All questions were answered and she would like to proceed.  Final decision per day surgeon Dr. Michela PitcherEly.   Bili 0.3, Lipase 43

## 2015-01-11 NOTE — Anesthesia Preprocedure Evaluation (Addendum)
Anesthesia Evaluation  Patient identified by MRN, date of birth, ID band Patient awake    Reviewed: Allergy & Precautions, NPO status , Patient's Chart, lab work & pertinent test results  History of Anesthesia Complications (+) PONV and history of anesthetic complications  Airway Mallampati: II  TM Distance: >3 FB Neck ROM: Full    Dental no notable dental hx.    Pulmonary neg pulmonary ROS,  breath sounds clear to auscultation  Pulmonary exam normal       Cardiovascular negative cardio ROS Normal cardiovascular examRhythm:Regular Rate:Normal     Neuro/Psych Anxiety Depression negative neurological ROS     GI/Hepatic negative GI ROS, Neg liver ROS,   Endo/Other  negative endocrine ROS  Renal/GU negative Renal ROS  negative genitourinary   Musculoskeletal negative musculoskeletal ROS (+)   Abdominal   Peds negative pediatric ROS (+)  Hematology negative hematology ROS (+)   Anesthesia Other Findings   Reproductive/Obstetrics (+) Breast feeding                             Anesthesia Physical Anesthesia Plan  ASA: II  Anesthesia Plan: General   Post-op Pain Management:    Induction: Intravenous  Airway Management Planned: Oral ETT  Additional Equipment:   Intra-op Plan:   Post-operative Plan: Extubation in OR  Informed Consent: I have reviewed the patients History and Physical, chart, labs and discussed the procedure including the risks, benefits and alternatives for the proposed anesthesia with the patient or authorized representative who has indicated his/her understanding and acceptance.   Dental advisory given  Plan Discussed with: CRNA and Surgeon  Anesthesia Plan Comments:         Anesthesia Quick Evaluation

## 2015-01-11 NOTE — ED Provider Notes (Signed)
[facility] Emergency Department Provider Note    ____________________________________________  Time seen: 0350  I have reviewed the triage vital signs and the nursing notes.   HISTORY  Chief Complaint Chest Pain and Nausea   History limited by: Not Limited   HPI Kelly [name] is a 34 y.o. female who presents to the emergency department with complaint of chest and back pain. She states that the pain started suddenly and woke her from her sleep. She describes the pain as extending from the epigastric region all the way up her throat. She has a sensation of burning in her throat. This pain does radiate to the back. It has been persistent. It is been related with some nausea but no vomiting. She has not noticed any recent changes in her defecation. She states she has never had pain like this before. She does have a history of heartburn and tried taking one Zantac without any relief. Denies any fevers.     History reviewed. No pertinent past medical history.  There are no active problems to display for this patient.   History reviewed. No pertinent past surgical history.  Current Outpatient Rx  Name  Route  Sig  Dispense  Refill  . FENUGREEK PO   Oral   Take by mouth daily.         Marland Kitchen loratadine (CLARITIN) 10 MG tablet   Oral   Take 10 mg by mouth daily.         . Multiple Vitamin (MULTIVITAMIN) tablet   Oral   Take 1 tablet by mouth daily.         . sertraline (ZOLOFT) 50 MG tablet   Oral   Take 50 mg by mouth daily.           Allergies Review of patient's allergies indicates no known allergies.  History reviewed. No pertinent family history.  Social History History  Substance Use Topics  . Smoking status: Never Smoker   . Smokeless tobacco: Never Used  . Alcohol Use: No    Review of Systems  Constitutional: Negative for fever. Cardiovascular: Positive for chest pain. Respiratory: Negative for shortness of  breath. Gastrointestinal: Positive for epigastric pain and nausea. Genitourinary: Negative for dysuria. Musculoskeletal: Positive for back pain. Skin: Negative for rash. Neurological: Negative for headaches, focal weakness or numbness.   10-point ROS otherwise negative.  ____________________________________________   PHYSICAL EXAM:  VITAL SIGNS: ED Triage Vitals  Enc Vitals Group     BP 01/11/15 0300 120/69 mmHg     Pulse Rate 01/11/15 0300 82     Resp 01/11/15 0300 18     Temp 01/11/15 0300 97.5 F (36.4 C)     Temp Source 01/11/15 0300 Oral     SpO2 01/11/15 0300 98 %     Weight 01/11/15 0300 215 lb (97.523 kg)     Height 01/11/15 0300  (1.778 m)     Head Cir --      Peak Flow --      Pain Score 01/11/15 0301 6   Constitutional: Alert and oriented. Uncomfortable appearing. Eyes: Conjunctivae are normal. PERRL. Normal extraocular movements. ENT   Head: Normocephalic and atraumatic.   Nose: No congestion/rhinnorhea.   Mouth/Throat: Mucous membranes are moist.   Neck: No stridor. Hematological/Lymphatic/Immunilogical: No cervical lymphadenopathy. Cardiovascular: Normal rate, regular rhythm.  No murmurs, rubs, or gallops. Respiratory: Normal respiratory effort without tachypnea nor retractions. Breath sounds are clear and equal bilaterally. No wheezes/rales/rhonchi. Gastrointestinal: Soft, tender  to palpation in the right upper quadrant and epigastric regions. Some guarding. Genitourinary: Deferred Musculoskeletal: Normal range of motion in all extremities. No joint effusions.  No lower extremity tenderness nor edema. Neurologic:  Normal speech and language. No gross focal neurologic deficits are appreciated. Speech is normal.  Skin:  Skin is warm, dry and intact. No rash noted. Psychiatric: Mood and affect are normal. Speech and behavior are normal. Patient exhibits appropriate insight and judgment.  ____________________________________________     LABS (pertinent positives/negatives)  Labs Reviewed  BASIC METABOLIC PANEL - Abnormal; Notable for the following:    Potassium 3.2 (*)    Glucose, Bld 113 (*)    All other components within normal limits  HEPATIC FUNCTION PANEL - Abnormal; Notable for the following:    Alkaline Phosphatase 130 (*)    Bilirubin, Direct <0.1 (*)    All other components within normal limits  CBC  TROPONIN I  LIPASE, BLOOD  POC URINE PREG, ED  POCT PREGNANCY, URINE     ____________________________________________  EKG  I, Phineas SemenGraydon Jadelynn Boylan, attending physician, personally viewed and interpreted this EKG  EKG Time: 0310 Rate: 81 Rhythm: NSR Axis: normal Intervals: qtc 434 QRS: narrow ST changes: no st elevation ____________________________________________    RADIOLOGY  RUQ US IMPRESSION: Cholelithiasis and sonographic Murphy's sign concerning for acute cholecystitis.  ____________________________________________   PROCEDURES  Procedure(s) performed: None  Critical Care performed: No  ____________________________________________   INITIAL IMPRESSION / ASSESSMENT AND PLAN / ED COURSE  Pertinent labs & imaging results that were available during my care of the patient were reviewed by me and considered in my medical decision making (see chart for details).  Patient presents with concern for burning chest pain and back pain. On exam patient does appear uncomfortable. She was tender in the RUQ and epigastric region. US was obtained which did show a gallstone at the neck of the gallbladder. Surgery was contacted.  ____________________________________________   FINAL CLINICAL IMPRESSION(S) / ED DIAGNOSES  Final diagnoses:  Pain  Gallbladder calculus     Phineas SemenGraydon Courtnay Petrilla, MD 01/11/15 [phone]

## 2015-01-11 NOTE — Anesthesia Postprocedure Evaluation (Signed)
  Anesthesia Post-op Note  Patient: Kelly RollerJulie Denise [name]  Procedure(s) Performed: Procedure(s): LAPAROSCOPIC CHOLECYSTECTOMY WITH INTRAOPERATIVE CHOLANGIOGRAM (N/A)  Anesthesia type:General  Patient location: PACU  Post pain: Pain level controlled  Post assessment: Post-op Vital signs reviewed, Patient's Cardiovascular Status Stable, Respiratory Function Stable, Patent Airway and No signs of Nausea or vomiting  Post vital signs: Reviewed and stable  Last Vitals:  Filed Vitals:   01/11/15 1413  BP: 124/70  Pulse: 70  Temp:   Resp: 10    Level of consciousness: awake, alert  and patient cooperative  Complications: No apparent anesthesia complications

## 2015-01-11 NOTE — Progress Notes (Signed)

## 2015-01-11 NOTE — Progress Notes (Signed)
Patient off unit for proceedure

## 2015-01-11 NOTE — Anesthesia Procedure Notes (Signed)
Procedure Name: Intubation Date/Time: 01/11/2015 12:18 PM Performed by: Stormy FabianURTIS, Maegan Buller Pre-anesthesia Checklist: Patient identified, Patient being monitored, Timeout performed, Emergency Drugs available and Suction available Patient Re-evaluated:Patient Re-evaluated prior to inductionOxygen Delivery Method: Circle system utilized Preoxygenation: Pre-oxygenation with 100% oxygen Intubation Type: IV induction Ventilation: Mask ventilation without difficulty Laryngoscope Size: Mac and 3 Grade View: Grade I Tube type: Oral Tube size: 7.0 mm Number of attempts: 1 Airway Equipment and Method: Stylet Placement Confirmation: ETT inserted through vocal cords under direct vision,  positive ETCO2 and breath sounds checked- equal and bilateral Secured at: 21 cm Tube secured with: Tape Dental Injury: Teeth and Oropharynx as per pre-operative assessment

## 2015-01-11 NOTE — Transfer of Care (Signed)
  Immediate Anesthesia Transfer of Care Note  Patient: Kelly RollerJulie Denise [name]  Procedure(s) Performed: Procedure(s): LAPAROSCOPIC CHOLECYSTECTOMY WITH INTRAOPERATIVE CHOLANGIOGRAM (N/A)  Patient Location: PACU  Anesthesia Type:General  Level of Consciousness: sedated  Airway & Oxygen Therapy: Patient Spontanous Breathing and Patient connected to face mask oxygen  Post-op Assessment: Report given to RN and Post -op Vital signs reviewed and stable  Post vital signs: Reviewed and stable  Last Vitals:  Filed Vitals:   01/11/15 1328  BP: 130/71  Pulse: 92  Temp: 37.4 C  Resp: 21    Complications: No apparent anesthesia complications

## 2015-01-12 ENCOUNTER — Encounter: Payer: Self-pay | Admitting: Anesthesiology

## 2015-01-12 LAB — GLUCOSE, CAPILLARY: Glucose-Capillary: 148 mg/dL — ABNORMAL HIGH (ref 65–99)

## 2015-01-12 LAB — HEMOGLOBIN AND HEMATOCRIT, BLOOD
HEMATOCRIT: 32.1 % — AB (ref 35.0–47.0)
Hemoglobin: 10.6 g/dL — ABNORMAL LOW (ref 12.0–16.0)

## 2015-01-12 MED ORDER — PANTOPRAZOLE SODIUM 40 MG PO TBEC
40.0000 mg | DELAYED_RELEASE_TABLET | Freq: Every day | ORAL | Status: DC
  Administered 2015-01-12 – 2015-01-13 (×2): 40 mg via ORAL
  Filled 2015-01-12 (×2): qty 1

## 2015-01-12 NOTE — Anesthesia Preprocedure Evaluation (Deleted)
Anesthesia Evaluation Anesthesia Physical Anesthesia Plan Anesthesia Quick Evaluation  

## 2015-01-12 NOTE — Progress Notes (Signed)
1 Day Post-Op   Subjective:  She had an episode of syncope last evening associated with going to the bathroom. She became pale lightheaded and diaphoretic and passed out. Her vital signs remained stable. Her hemoglobin was rechecked and was down 3 g from admission at 13.7-10.6. She's had no obvious blood loss. Her blood pressure and heart rate remained stable. She continues to have good output.  She feels better this morning. There is no evidence of any cardiac or neurological problem.  Vital signs in last 24 hours: Temp:  [97.6 F (36.4 C)-100 F (37.8 C)] 98 F (36.7 C) (07/09 0808) Pulse Rate:  [57-128] 82 (07/09 0808) Resp:  [10-21] 16 (07/09 0808) BP: (108-134)/(51-71) 109/54 mmHg (07/09 0808) SpO2:  [95 %-100 %] 98 % (07/09 0808)    Intake/Output from previous day: 07/08 0701 - 07/09 0700 In: 3138.3 [P.O.:600; I.V.:2538.3] Out: -   GI: Abdomen is soft with minimal incisional tenderness. She has active bowel sounds. There is no significant wound drainage.  Lab Results:  CBC  Recent Labs  01/11/15 0315 01/12/15 0432  WBC 8.2  --   HGB 13.7 10.6*  HCT 41.4 32.1*  PLT 162  --    CMP     Component Value Date/Time   NA 142 01/11/2015 0315   NA 142 07/23/2014 1354   K 3.2* 01/11/2015 0315   K 3.6 07/23/2014 1354   CL 107 01/11/2015 0315   CL 109* 07/23/2014 1354   CO2 25 01/11/2015 0315   CO2 24 07/23/2014 1354   GLUCOSE 113* 01/11/2015 0315   GLUCOSE 148* 07/23/2014 1354   BUN 13 01/11/2015 0315   BUN 6* 07/23/2014 1354   CREATININE 0.55 01/11/2015 0315   CREATININE 0.62 07/23/2014 1354   CALCIUM 9.2 01/11/2015 0315   CALCIUM 8.4* 07/23/2014 1354   PROT 7.3 01/11/2015 0315   PROT 6.4 07/23/2014 1354   ALBUMIN 4.1 01/11/2015 0315   ALBUMIN 2.3* 07/23/2014 1354   AST 31 01/11/2015 0315   AST 38* 07/23/2014 1354   ALT 22 01/11/2015 0315   ALT 26 07/23/2014 1354   ALKPHOS 130* 01/11/2015 0315   ALKPHOS 99 07/23/2014 1354   BILITOT 0.3 01/11/2015 0315   GFRNONAA >60 01/11/2015 0315   GFRAA >60 01/11/2015 0315   PT/INR No results for input(s): LABPROT, INR in the last 72 hours.  Studies/Results: Dg Cholangiogram Operative  01/11/2015   CLINICAL DATA:  Intraoperative cholangiogram for cholelithiasis.  EXAM: INTRAOPERATIVE CHOLANGIOGRAM  FLUOROSCOPY TIME:  36 seconds  COMPARISON:  Right upper quadrant abdominal ultrasound - 01/11/2015  FINDINGS: Two spot intra operative cholangiographic images of the right upper abdominal quadrant during laparoscopic cholecystectomy are provided for review.  Surgical clips overlie the expected location of the gallbladder fossa.  Contrast injection demonstrates selective cannulation of the central aspect of the cystic duct.  There is passage of contrast through the central aspect of the cystic duct with filling of a non dilated common bile duct. There is passage of contrast though the CBD and into the descending portion of the duodenum.  There is minimal reflux of injected contrast into the common hepatic duct and central aspect of the non dilated intrahepatic biliary system.  There are no discrete filling defects within the opacified portions of the biliary system to suggest the presence of choledocholithiasis.  IMPRESSION: No definite evidence of choledocholithiasis.   Electronically Signed   By: Simonne Come M.D.   On: 01/11/2015 13:44   Dg Chest [facility]  01/11/2015   CLINICAL DATA:  Chest pain  EXAM: PORTABLE CHEST - 1 VIEW  COMPARISON:  None.  FINDINGS: Normal heart size and mediastinal contours. No acute infiltrate or edema. No effusion or pneumothorax. No acute osseous findings.  IMPRESSION: Negative portable chest.   Electronically Signed   By: Marnee SpringJonathon  Watts M.D.   On: 01/11/2015 03:27   [place] Abdomen Limited Ruq  01/11/2015   CLINICAL DATA:  RIGHT upper quadrant pain for several hours.  EXAM: US ABDOMEN LIMITED - RIGHT UPPER QUADRANT  COMPARISON:  None.  FINDINGS: Gallbladder:  Gallbladder distention with multiple  echogenic gallstones with acoustic shadowing. 1.9 cm stone appears impacted at the gallbladder neck. No wall thickening or pericholecystic fluid. However, sonographic Murphy's sign elicited.  Common bile duct:  Diameter: 3.8 mm  Liver:  No focal lesion identified. Within normal limits in parenchymal echogenicity.  IMPRESSION: Cholelithiasis and sonographic Murphy's sign concerning for acute cholecystitis.   Electronically Signed   By: Awilda Metroourtnay  Bloomer M.D.   On: 01/11/2015 05:10    Assessment/Plan: She is improving today. I do not know the source of her problem last night. I would be concerned about possibility of postoperative bleed. We will continue to follow her hemoglobin and hematocrit. We will increase her activity level. We'll advance her diet. This plan was discussed with her in detail.

## 2015-01-12 NOTE — Progress Notes (Signed)
Key Points: Use following P&T approved IV to PO antibiotic change policy.  CONCERNING: IV to Oral Route Change Policy  RECOMMENDATION: This patient is receiving pantoprazole by the intravenous route.  Based on criteria approved by the Pharmacy and Therapeutics Committee, the intravenous medication(s) is/are being converted to the equivalent oral dose form(s).   DESCRIPTION: These criteria include:  The patient is eating (either orally or via tube) and/or has been taking other orally administered medications for a least 24 hours  The patient has no evidence of active gastrointestinal bleeding or impaired GI absorption (gastrectomy, short bowel, patient on TNA or NPO).  If you have questions about this conversion, please contact the Pharmacy Department  []   [phone]( [phone] )  [facility] [x]   [phone]( [phone] )  [facility] []   [phone]( [phone] )  [facility] []   [phone]( [phone] )  [facility] []   [phone]( [phone] )  [facility]   [place],Earnestine Tuohey G, [facility] Surgery CenterRPH 01/12/2015 1:29 PM

## 2015-01-13 LAB — CBC
HEMATOCRIT: 25.9 % — AB (ref 35.0–47.0)
Hemoglobin: 8.6 g/dL — ABNORMAL LOW (ref 12.0–16.0)
MCH: 30.1 pg (ref 26.0–34.0)
MCHC: 33.2 g/dL (ref 32.0–36.0)
MCV: 90.7 fL (ref 80.0–100.0)
Platelets: 121 10*3/uL — ABNORMAL LOW (ref 150–440)
RBC: 2.86 MIL/uL — ABNORMAL LOW (ref 3.80–5.20)
RDW: 13.3 % (ref 11.5–14.5)
WBC: 8.4 10*3/uL (ref 3.6–11.0)

## 2015-01-13 LAB — HEMOGLOBIN AND HEMATOCRIT, BLOOD
HCT: 24.7 % — ABNORMAL LOW (ref 35.0–47.0)
Hemoglobin: 8.2 g/dL — ABNORMAL LOW (ref 12.0–16.0)

## 2015-01-13 NOTE — Progress Notes (Signed)
Her repeat hemoglobin is essentially the same at 8.2. We will continue to follow her rather than intervene at this point.

## 2015-01-13 NOTE — Progress Notes (Signed)
2 Days Post-Op   Subjective:  She is feeling better today. She had a little bit lightheadedness last night which improved. She is not really been out of bed very much as yet. Overall she feels better with less pain. Her hemoglobin went from 13.7 on July 8-10 0.3 on July 9 with a repeat that afternoon at 10.6. This morning 12 hours later it is 8.6. Her vital signs have remained stable although she was mildly tachycardic late yesterday afternoon.  She has no nausea or vomiting.  Vital signs in last 24 hours: Temp:  [98 F (36.7 C)-98.3 F (36.8 C)] 98.3 F (36.8 C) (07/10 0104) Pulse Rate:  [76-82] 80 (07/10 0104) Resp:  [16] 16 (07/10 0104) BP: (109-123)/(49-65) 123/65 mmHg (07/10 0104) SpO2:  [92 %-98 %] 92 % (07/10 0104) Last BM Date: 01/11/15  Intake/Output from previous day: 07/09 0701 - 07/10 0700 In: 2324 [P.O.:360; I.V.:1964] Out: 350 [Urine:350]  GI: abnormal findings:  None and Abdomen is soft her abdomen is unremarkable to present time. She has good bowel sounds no specific abdominal tenderness. Her wounds look good.  Lab Results:  CBC  Recent Labs  01/11/15 0315 01/12/15 0432 01/13/15 0514  WBC 8.2  --  8.4  HGB 13.7 10.6* 8.6*  HCT 41.4 32.1* 25.9*  PLT 162  --  121*   CMP     Component Value Date/Time   NA 142 01/11/2015 0315   NA 142 07/23/2014 1354   K 3.2* 01/11/2015 0315   K 3.6 07/23/2014 1354   CL 107 01/11/2015 0315   CL 109* 07/23/2014 1354   CO2 25 01/11/2015 0315   CO2 24 07/23/2014 1354   GLUCOSE 113* 01/11/2015 0315   GLUCOSE 148* 07/23/2014 1354   BUN 13 01/11/2015 0315   BUN 6* 07/23/2014 1354   CREATININE 0.55 01/11/2015 0315   CREATININE 0.62 07/23/2014 1354   CALCIUM 9.2 01/11/2015 0315   CALCIUM 8.4* 07/23/2014 1354   PROT 7.3 01/11/2015 0315   PROT 6.4 07/23/2014 1354   ALBUMIN 4.1 01/11/2015 0315   ALBUMIN 2.3* 07/23/2014 1354   AST 31 01/11/2015 0315   AST 38* 07/23/2014 1354   ALT 22 01/11/2015 0315   ALT 26 07/23/2014  1354   ALKPHOS 130* 01/11/2015 0315   ALKPHOS 99 07/23/2014 1354   BILITOT 0.3 01/11/2015 0315   GFRNONAA >60 01/11/2015 0315   GFRAA >60 01/11/2015 0315   PT/INR No results for input(s): LABPROT, INR in the last 72 hours.  Studies/Results: Dg Cholangiogram Operative  01/11/2015   CLINICAL DATA:  Intraoperative cholangiogram for cholelithiasis.  EXAM: INTRAOPERATIVE CHOLANGIOGRAM  FLUOROSCOPY TIME:  36 seconds  COMPARISON:  Right upper quadrant abdominal ultrasound - 01/11/2015  FINDINGS: Two spot intra operative cholangiographic images of the right upper abdominal quadrant during laparoscopic cholecystectomy are provided for review.  Surgical clips overlie the expected location of the gallbladder fossa.  Contrast injection demonstrates selective cannulation of the central aspect of the cystic duct.  There is passage of contrast through the central aspect of the cystic duct with filling of a non dilated common bile duct. There is passage of contrast though the CBD and into the descending portion of the duodenum.  There is minimal reflux of injected contrast into the common hepatic duct and central aspect of the non dilated intrahepatic biliary system.  There are no discrete filling defects within the opacified portions of the biliary system to suggest the presence of choledocholithiasis.  IMPRESSION: No definite evidence of choledocholithiasis.  Electronically Signed   By: Simonne Come M.D.   On: 01/11/2015 13:44    Assessment/Plan: With the drop in hemoglobin particularly from admission I would suspect that she is having some postoperative bleeding problems. She did have a significantly inflamed gallbladder but there was not any obvious source of blood loss at the time of surgery. We actually observe the abdomen for some time after surgery because of the multiple he's to be certain that there was not any bleeding. We will continue to rehydrate her aggressively slowly increase her activity. I'll  repeat her hemoglobin tomorrow morning I was she becomes unstable and we will give her a type and screen in case she is to have some blood. I discussed this possibility with the patient and her husband today.

## 2015-01-14 DIAGNOSIS — K810 Acute cholecystitis: Secondary | ICD-10-CM

## 2015-01-14 LAB — ABO/RH: ABO/RH(D): A POS

## 2015-01-14 LAB — TYPE AND SCREEN
ABO/RH(D): A POS
Antibody Screen: NEGATIVE

## 2015-01-14 LAB — CBC
HCT: 24.6 % — ABNORMAL LOW (ref 35.0–47.0)
HEMOGLOBIN: 8.2 g/dL — AB (ref 12.0–16.0)
MCH: 30 pg (ref 26.0–34.0)
MCHC: 33.3 g/dL (ref 32.0–36.0)
MCV: 90.1 fL (ref 80.0–100.0)
Platelets: 121 10*3/uL — ABNORMAL LOW (ref 150–440)
RBC: 2.73 MIL/uL — ABNORMAL LOW (ref 3.80–5.20)
RDW: 12.7 % (ref 11.5–14.5)
WBC: 7.9 10*3/uL (ref 3.6–11.0)

## 2015-01-14 MED ORDER — HYDROCODONE-ACETAMINOPHEN 5-325 MG PO TABS: 1.0000 | ORAL_TABLET | ORAL | Status: DC | PRN

## 2015-01-14 NOTE — Discharge Summary (Signed)
Physician Discharge Summary  Patient ID: Kelly [name] MRN: [id number] DOB/AGE: Dec 10, 1980 34 y.o.  Admit date: 01/11/2015 Discharge date: 01/14/2015  Admission Diagnoses: Acute cholecystitis and cholelithiasis Discharge Diagnoses:  Active Problems:   Cholecystitis, acute   Cholelithiases   Discharged Condition: Stable  Hospital Course: Patient was admitted following laparoscopic cholecystectomy. She has somewhat of a drop in her hemoglobin had some dizziness. This was watched remained stable. The patient improved. By the 11th she was stable and improved for discharge. Her initial hemoglobin was 10.6 which then dropped to 8.2 and remained stable 8.2 on the day of her discharge. He had a adequate pain control is tolerating a regular diet and was ambulatory without difficulty.   Significant Diagnostic Studies: Ultrasound of the abdomen  Treatments: Laparoscopic cholecystectomy  Discharge Exam: Blood pressure 122/66, pulse 95, temperature 98.7 F (37.1 C), temperature source Oral, resp. rate 16, height 5\' 10"  (1.778 m), weight 97.523 kg (215 lb), last menstrual period 10/18/2013, SpO2 93 %. Her abdomen was soft and nontender incisions healed nicely.  Disposition:   Discharge Instructions    Diet general    Complete by:  As directed      Discharge instructions    Complete by:  As directed   DISCHARGE INSTRUCTIONS TO PATIENT  REMINDER:  Carry a list of your medications and allergies with you at all times Call your pharmacy at least 1 week in advance to refill prescriptions Do not mix any prescribed pain medicine with alcohol Do not drive any motor vehicles while taking pain medication. Take medications with food.  Do not retake a pain medication if you vomit after taking it.  Activity: no lifting more than 15 pounds until instructed by your doctor.   Dressing Care Instruction (if applicable):              Remove operative dressings in 48 hours.  May Shower-  Call office  if any questions regarding this activity.  Dry Dressing as needed to operative site.  Drain care instructions provided to you in the hospital.   Follow-up appointments (date to return to physician): Call for appointment with Dr. Natale LayMark Angeli Demilio, MD at [phone] or [phone]  If need MD on call after hours and on weekends call Hospital operator at [phone] as ask to speak to [facility].  Call Surgeon if you have: Temperature greater than 100.4 Persistent nausea and vomiting Severe uncontrolled pain Redness, tenderness, or signs of infection (pain, swelling, redness, odor or green/yellow discharge around the site) Difficulty breathing, headache or visual disturbances Hives Persistent dizziness or light-headedness Extreme fatigue Any other questions or concerns you may have after discharge  In an emergency, call 911 or go to an Emergency Department at a nearby hospital  Diet:  Resume your usual diet.  Avoid spicy, greasy or heavy foods.  If you have nausea or vomiting, go back to liquids.  If you cannot keep liquids down, call your doctor.  Avoid alcohol consumption while on prescription pain medications. Good nutrition promotes healing. Increase fiber and fluids.     I understand and acknowledge receipt of the above instructions.  Patient or Guardian Signature                                                                    Date/Time                                                                                                                                        Physician's or R.N.'s Signature                                                                  Date/Time  The discharge instructions have been reviewed with the patient and/or Family Member/Parent/Guardian.  Patient and/or Family  Member/Parent/Guardian signed and retained a printed copy.     Increase activity slowly    Complete by:  As directed      Remove dressing in 24 hours    Complete by:  As directed             Medication List    TAKE these medications        FENUGREEK PO  Take by mouth daily.     HYDROcodone-acetaminophen 5-325 MG per tablet  Commonly known as:  NORCO/VICODIN  Take 1-2 tablets by mouth every 4 (four) hours as needed for moderate pain.     loratadine 10 MG tablet  Commonly known as:  CLARITIN  Take 10 mg by mouth daily.     multivitamin tablet  Take 1 tablet by mouth daily.     sertraline 50 MG tablet  Commonly known as:  ZOLOFT  Take 50 mg by mouth daily.           Follow-up Information    Follow up with Natale Lay, MD In 1 week.   Specialties:  Surgery, Radiology   Why:  [place] office location.   Contact information:   [address] [place] [address] [phone]       Signed: Natale Lay 01/14/2015, 12:17 PM

## 2015-01-14 NOTE — Progress Notes (Signed)
Postoperative day #3 status post left scopic cholecystectomy.  Patient feels tired.  She is having less pain than yesterday.  Hemoglobin is 8.2.  Filed Vitals:   01/13/15 0104 01/13/15 0916 01/13/15 1655 01/13/15 2329  BP: 123/65 114/69 136/67 122/66  Pulse: 80 102 103 95  Temp: 98.3 F (36.8 C) 99.1 F (37.3 C) 99 F (37.2 C) 98.7 F (37.1 C)  TempSrc: Oral Oral Oral Oral  Resp: 16   16  Height:      Weight:      SpO2: 92% 92% 95% 93%    CBC Latest Ref Rng 01/14/2015 01/13/2015 01/13/2015  WBC 3.6 - 11.0 K/uL 7.9 - 8.4  Hemoglobin 12.0 - 16.0 g/dL 8.2(L) 8.2(L) 8.6(L)  Hematocrit 35.0 - 47.0 % 24.6(L) 24.7(L) 25.9(L)  Platelets 150 - 440 K/uL 121(L) - 121(L)    Abdomen is soft and nontender. No obvious ecchymosis is noted.  Impression: Stable status post left scopic cholecystectomy with minor postoperative hemorrhage. There is currently no further signs of bleeding.  And patient is stable and improved for discharge. I will see her back in my office in [place] next week.

## 2015-01-15 LAB — SURGICAL PATHOLOGY

## 2015-01-23 ENCOUNTER — Other Ambulatory Visit
Admission: RE | Admit: 2015-01-23 | Discharge: 2015-01-23 | Disposition: A | Discharge status: Home / Self Care | Payer: BLUE CROSS/BLUE SHIELD | Source: Ambulatory Visit | Attending: Surgery | Admitting: Surgery

## 2015-01-23 ENCOUNTER — Encounter: Payer: Self-pay | Admitting: Surgery

## 2015-01-23 ENCOUNTER — Ambulatory Visit (INDEPENDENT_AMBULATORY_CARE_PROVIDER_SITE_OTHER): Payer: BLUE CROSS/BLUE SHIELD | Admitting: Surgery

## 2015-01-23 VITALS — BP 128/74 | HR 81 | Temp 98.3°F | Ht 70.0 in | Wt 210.0 lb

## 2015-01-23 DIAGNOSIS — D509 Iron deficiency anemia, unspecified: Secondary | ICD-10-CM | POA: Diagnosis not present

## 2015-01-23 LAB — IRON AND TIBC
Iron: 32 ug/dL (ref 28–170)
Saturation Ratios: 8 % — ABNORMAL LOW (ref 10.4–31.8)
TIBC: 401 ug/dL (ref 250–450)
UIBC: 369 ug/dL

## 2015-01-23 NOTE — Progress Notes (Signed)
Subjective:     Patient ID: Kelly [name], female   DOB: [birth year], 34 y.o.   MRN: [id number]  HPI 34 year old female underwent laparoscopic cholecystectomy complicated by a postoperative bleed not requiring reoperation or blood transfusion. Currently she is feeling well. She is without complaints. She's had no syncope and dizziness.  Review of Systems    as above Objective:   Physical Exam  Constitutional: She is oriented to person, place, and time. She appears well-developed and well-nourished. No distress.  Eyes: Pupils are equal, round, and reactive to light.  Abdominal: She exhibits no distension. There is no tenderness. There is no rebound.  Minor ecchymosis around umbilicus and lateral incision.  Neurological: She is oriented to person, place, and time.  Skin: Skin is warm and dry. She is not diaphoretic.  Psychiatric: She has a normal mood and affect. Her behavior is normal.       Assessment:      she seems to be doing well. Plan:     The patient was reassured. We will check hemoglobin today. I will see her back in the office as needed.

## 2015-01-23 NOTE — Patient Instructions (Signed)
Please tell your employer to fax your paperwork at [phone] for us to fill out. Thank you. Call us if you have any questions.

## 2019-12-19 DIAGNOSIS — E6609 Other obesity due to excess calories: Secondary | ICD-10-CM | POA: Insufficient documentation

## 2019-12-27 ENCOUNTER — Other Ambulatory Visit: Payer: Self-pay | Admitting: Obstetrics and Gynecology

## 2019-12-27 DIAGNOSIS — Z1231 Encounter for screening mammogram for malignant neoplasm of breast: Secondary | ICD-10-CM

## 2020-01-05 ENCOUNTER — Encounter: Payer: Self-pay | Admitting: Radiology

## 2020-01-05 ENCOUNTER — Ambulatory Visit
Admission: RE | Admit: 2020-01-05 | Discharge: 2020-01-05 | Disposition: A | Discharge status: Home / Self Care | Payer: No Typology Code available for payment source | Source: Ambulatory Visit | Attending: Obstetrics and Gynecology | Admitting: Obstetrics and Gynecology

## 2020-01-05 DIAGNOSIS — Z1231 Encounter for screening mammogram for malignant neoplasm of breast: Secondary | ICD-10-CM | POA: Insufficient documentation

## 2020-08-26 ENCOUNTER — Other Ambulatory Visit: Payer: Self-pay

## 2020-08-26 ENCOUNTER — Observation Stay
Admission: EM | Admit: 2020-08-26 | Discharge: 2020-08-27 | Disposition: A | Discharge status: Home / Self Care | Payer: No Typology Code available for payment source | Attending: Internal Medicine | Admitting: Internal Medicine

## 2020-08-26 ENCOUNTER — Observation Stay: Payer: No Typology Code available for payment source

## 2020-08-26 ENCOUNTER — Encounter: Payer: Self-pay | Admitting: Emergency Medicine

## 2020-08-26 ENCOUNTER — Emergency Department: Payer: No Typology Code available for payment source

## 2020-08-26 ENCOUNTER — Observation Stay
Admit: 2020-08-26 | Discharge: 2020-08-26 | Disposition: A | Discharge status: Home / Self Care | Payer: No Typology Code available for payment source | Attending: Internal Medicine | Admitting: Internal Medicine

## 2020-08-26 DIAGNOSIS — R0602 Shortness of breath: Secondary | ICD-10-CM | POA: Insufficient documentation

## 2020-08-26 DIAGNOSIS — R000 Tachycardia, unspecified: Secondary | ICD-10-CM | POA: Diagnosis present

## 2020-08-26 DIAGNOSIS — E876 Hypokalemia: Secondary | ICD-10-CM | POA: Diagnosis not present

## 2020-08-26 DIAGNOSIS — R42 Dizziness and giddiness: Secondary | ICD-10-CM | POA: Diagnosis not present

## 2020-08-26 DIAGNOSIS — I480 Paroxysmal atrial fibrillation: Secondary | ICD-10-CM | POA: Diagnosis not present

## 2020-08-26 DIAGNOSIS — I4891 Unspecified atrial fibrillation: Secondary | ICD-10-CM

## 2020-08-26 DIAGNOSIS — F419 Anxiety disorder, unspecified: Secondary | ICD-10-CM | POA: Diagnosis not present

## 2020-08-26 DIAGNOSIS — E860 Dehydration: Secondary | ICD-10-CM

## 2020-08-26 DIAGNOSIS — Z79899 Other long term (current) drug therapy: Secondary | ICD-10-CM | POA: Insufficient documentation

## 2020-08-26 DIAGNOSIS — Z20822 Contact with and (suspected) exposure to covid-19: Secondary | ICD-10-CM | POA: Insufficient documentation

## 2020-08-26 DIAGNOSIS — F329 Major depressive disorder, single episode, unspecified: Secondary | ICD-10-CM | POA: Insufficient documentation

## 2020-08-26 LAB — CBC WITH DIFFERENTIAL/PLATELET
Abs Immature Granulocytes: 0.04 10*3/uL (ref 0.00–0.07)
Basophils Absolute: 0 10*3/uL (ref 0.0–0.1)
Basophils Relative: 0 %
Eosinophils Absolute: 0.1 10*3/uL (ref 0.0–0.5)
Eosinophils Relative: 1 %
HCT: 47.6 % — ABNORMAL HIGH (ref 36.0–46.0)
Hemoglobin: 17.1 g/dL — ABNORMAL HIGH (ref 12.0–15.0)
Immature Granulocytes: 0 %
Lymphocytes Relative: 22 %
Lymphs Abs: 3 10*3/uL (ref 0.7–4.0)
MCH: 31.1 pg (ref 26.0–34.0)
MCHC: 35.9 g/dL (ref 30.0–36.0)
MCV: 86.7 fL (ref 80.0–100.0)
Monocytes Absolute: 1.1 10*3/uL — ABNORMAL HIGH (ref 0.1–1.0)
Monocytes Relative: 8 %
Neutro Abs: 9.3 10*3/uL — ABNORMAL HIGH (ref 1.7–7.7)
Neutrophils Relative %: 69 %
Platelets: 296 10*3/uL (ref 150–400)
RBC: 5.49 MIL/uL — ABNORMAL HIGH (ref 3.87–5.11)
RDW: 12.6 % (ref 11.5–15.5)
WBC: 13.5 10*3/uL — ABNORMAL HIGH (ref 4.0–10.5)
nRBC: 0 % (ref 0.0–0.2)

## 2020-08-26 LAB — BASIC METABOLIC PANEL
Anion gap: 10 (ref 5–15)
BUN: 11 mg/dL (ref 6–20)
CO2: 22 mmol/L (ref 22–32)
Calcium: 9.2 mg/dL (ref 8.9–10.3)
Chloride: 108 mmol/L (ref 98–111)
Creatinine, Ser: 0.77 mg/dL (ref 0.44–1.00)
GFR, Estimated: >60 mL/min (ref >60)
Glucose, Bld: 132 mg/dL — ABNORMAL HIGH (ref 70–99)
Potassium: 3.3 mmol/L — ABNORMAL LOW (ref 3.5–5.1)
Sodium: 140 mmol/L (ref 135–145)

## 2020-08-26 LAB — TSH: TSH: 2.102 u[IU]/mL (ref 0.350–4.500)

## 2020-08-26 LAB — RESP PANEL BY RT-PCR (FLU A&B, COVID) ARPGX2
Influenza A by PCR: NEGATIVE
Influenza B by PCR: NEGATIVE
SARS Coronavirus 2 by RT PCR: NEGATIVE

## 2020-08-26 LAB — TROPONIN I (HIGH SENSITIVITY)
Troponin I (High Sensitivity): 4 ng/L (ref <18)
Troponin I (High Sensitivity): 9 ng/L (ref <18)

## 2020-08-26 LAB — D-DIMER, QUANTITATIVE: D-Dimer, Quant: 0.3 ug/mL-FEU (ref 0.00–0.50)

## 2020-08-26 LAB — BRAIN NATRIURETIC PEPTIDE: B Natriuretic Peptide: 92.7 pg/mL (ref 0.0–100.0)

## 2020-08-26 LAB — HIV ANTIBODY (ROUTINE TESTING W REFLEX): HIV Screen 4th Generation wRfx: NONREACTIVE

## 2020-08-26 LAB — MAGNESIUM: Magnesium: 2 mg/dL (ref 1.7–2.4)

## 2020-08-26 MED ORDER — IOHEXOL 300 MG/ML  SOLN
75.0000 mL | Freq: Once | INTRAMUSCULAR | Status: AC | PRN
  Administered 2020-08-26: 75 mL via INTRAVENOUS

## 2020-08-26 MED ORDER — POTASSIUM CHLORIDE 10 MEQ/100ML IV SOLN
10.0000 meq | Freq: Once | INTRAVENOUS | Status: AC
  Administered 2020-08-26: 10 meq via INTRAVENOUS
  Filled 2020-08-26: qty 100

## 2020-08-26 MED ORDER — ASPIRIN EC 81 MG PO TBEC
81.0000 mg | DELAYED_RELEASE_TABLET | Freq: Every day | ORAL | Status: DC
  Administered 2020-08-27: 81 mg via ORAL
  Filled 2020-08-26: qty 1

## 2020-08-26 MED ORDER — SODIUM CHLORIDE 0.9 % IV SOLN: INTRAVENOUS | Status: DC

## 2020-08-26 MED ORDER — POTASSIUM CHLORIDE CRYS ER 20 MEQ PO TBCR
40.0000 meq | EXTENDED_RELEASE_TABLET | Freq: Once | ORAL | Status: AC
  Administered 2020-08-26: 40 meq via ORAL
  Filled 2020-08-26: qty 2

## 2020-08-26 MED ORDER — SODIUM CHLORIDE 0.9 % IV BOLUS
1000.0000 mL | Freq: Once | INTRAVENOUS | Status: AC
  Administered 2020-08-26: 1000 mL via INTRAVENOUS

## 2020-08-26 MED ORDER — METOPROLOL TARTRATE 25 MG PO TABS
25.0000 mg | ORAL_TABLET | Freq: Two times a day (BID) | ORAL | Status: DC
  Administered 2020-08-26 – 2020-08-27 (×2): 25 mg via ORAL
  Filled 2020-08-26 (×2): qty 1

## 2020-08-26 MED ORDER — ENOXAPARIN SODIUM 40 MG/0.4ML ~~LOC~~ SOLN
40.0000 mg | SUBCUTANEOUS | Status: DC
  Administered 2020-08-26: 40 mg via SUBCUTANEOUS
  Filled 2020-08-26: qty 0.4

## 2020-08-26 NOTE — Progress Notes (Signed)
*  PRELIMINARY RESULTS* Echocardiogram 2D Echocardiogram has been performed.  Kelly [name] 08/26/2020, 6:44 PM

## 2020-08-26 NOTE — Consult Note (Signed)
CARDIOLOGY CONSULT NOTE               Patient ID: Kelly [name] MRN: [id number] DOB/AGE: [birth year] 40 y.o.  Admit date: 08/26/2020 Referring Physician Dr. Dionne Bucy  Primary Physician Dr. Burnadette Pop  Primary Cardiologist n/a Reason for Consultation New onset atrial fibrillation with RVR   HPI: Kelly [name] is a 40 year old female with a past medical history significant for obesity who presented to the ED on 08/26/20 for an acute onset of palpitations with associated shortness of breath and lightheadedness.  She was noted to be in new onset atrial fibrillation.  She has since spontaneously converted back to NSR on normal saline and potassium supplementation.  Other workup was significant for potassium of 3.3, high sensitivity troponin of 4, BNP of 92, and D-dimer negative.  Chest xray was negative for any acute process but did revealed a 1.4cm mid right lobe lung nodule.    08/26/20: Kelly [name] admits to generalized fatigue and feeling worn out, but denies any recurrent episodes of atrial fibrillation.  She denies any history of alcohol or illicit drug abuse.  She has no prior history of OSA and denies snoring or apneic episodes.  She also denies chest pain, chest pressure, shortness of breath, lower extremity swelling, orthopnea, PND, or syncopal/presyncopal episodes.  She has a strong family history of heart disease.   Review of systems complete and found to be negative unless listed above     Past Medical History:  Diagnosis Date  . Anxiety   . Depression   . PONV (postoperative nausea and vomiting)     Past Surgical History:  Procedure Laterality Date  . CESAREAN SECTION N/A    X2  . CHOLECYSTECTOMY N/A 01/11/2015   Procedure: LAPAROSCOPIC CHOLECYSTECTOMY WITH INTRAOPERATIVE CHOLANGIOGRAM;  Surgeon: Tiney Rouge III, MD;  Location: [facility] ORS;  Service: General;  Laterality: N/A;    (Not in a hospital admission)  Social History   Socioeconomic History  . Marital  status: Married    Spouse name: Not on file  . Number of children: Not on file  . Years of education: Not on file  . Highest education level: Not on file  Occupational History  . Not on file  Tobacco Use  . Smoking status: Never Smoker  . Smokeless tobacco: Never Used  Substance and Sexual Activity  . Alcohol use: No  . Drug use: No  . Sexual activity: Yes  Other Topics Concern  . Not on file  Social History Narrative  . Not on file   Social Determinants of Health   Financial Resource Strain: Not on file  Food Insecurity: Not on file  Transportation Needs: Not on file  Physical Activity: Not on file  Stress: Not on file  Social Connections: Not on file  Intimate Partner Violence: Not on file    Family History  Problem Relation Age of Onset  . Diabetes Mother   . Hypertension Mother   . Hyperlipidemia Mother   . Heart disease Father       Review of systems complete and found to be negative unless listed above      PHYSICAL EXAM  General: Well developed, well nourished, in no acute distress HEENT:  Normocephalic and atramatic Neck:  No JVD.  Lungs: Clear bilaterally to auscultation and percussion. Heart: HRRR . Normal S1 and S2 without gallops or murmurs.  Abdomen: Bowel sounds are positive, abdomen soft and non-tender  Msk:  Back normal, normal gait. Normal strength  and tone for age. Extremities: No clubbing, cyanosis or edema.   Neuro: Alert and oriented X 3. Psych:  Good affect, responds appropriately  Labs:   Lab Results  Component Value Date   WBC 13.5 (H) 08/26/2020   HGB 17.1 (H) 08/26/2020   HCT 47.6 (H) 08/26/2020   MCV 86.7 08/26/2020   PLT 296 08/26/2020    Recent Labs  Lab 08/26/20 1113  NA 140  K 3.3*  CL 108  CO2 22  BUN 11  CREATININE 0.77  CALCIUM 9.2  GLUCOSE 132*   Lab Results  Component Value Date   TROPONINI <0.03 01/11/2015   No results found for: CHOL No results found for: HDL No results found for: LDLCALC No  results found for: TRIG No results found for: CHOLHDL No results found for: LDLDIRECT    Radiology: DG Chest Portable 1 View  Result Date: 08/26/2020 CLINICAL DATA:  New onset atrial fibrillation EXAM: PORTABLE CHEST 1 VIEW COMPARISON:  01/11/2015 FINDINGS: Cardiomediastinal silhouette and pulmonary vasculature are within normal limits. No focal airspace opacity to indicate pneumonia. Findings suspicious for right mid lung nodule measuring 1.4 cm. Lungs otherwise clear. IMPRESSION: 1. No acute cardiopulmonary process. 2. Findings suspicious for 1.4 cm right mid lung nodule. Further evaluation with contrast-enhanced chest CT should be performed. Electronically Signed   By: Acquanetta Belling M.D.   On: 08/26/2020 11:52    EKG: Atrial fibrillation with rapid ventricular response, rate of 177bpm   ASSESSMENT AND PLAN:  1.  New onset atrial fibrillation   -Presented to the ED in new onset atrial fibrillation RVR, rate in the 170s  -Has spontaneously converted to NSR on normal saline and potassium supplementation (3.3 on admission)  -Echocardiogram pending   -With a CHA2D2s VASc score of 1, for female gender, will defer anticoagulation at this time as she spontaneously converted to NSR  -Will continue to follow on telemetry while admitted and recommend outpatient Holter monitor for further evaluation   2.  Hypokalemia   -Continue potassium supplementation   3.  Family history of premature CAD  -Consider outpatient stress test for strong family history   The history, physical exam findings, and plan of care were all discussed with Dr. Harold Hedge, and all decision making was made in collaboration.   Signed: Andi Hence PA-C 08/26/2020, 2:04 PM

## 2020-08-26 NOTE — ED Provider Notes (Signed)
[facility] Emergency Department Provider Note ____________________________________________   Event Date/Time   First MD Initiated Contact with Patient 08/26/20 1126     (approximate)  I have reviewed the triage vital signs and the nursing notes.   HISTORY  Chief Complaint Tachycardia and Near Syncope    HPI Kelly [name] is a 40 y.o. female with PMH as noted below but no prior cardiac history who presents with palpitations, acute onset around 5 AM today when she woke up, persistent since then, and associated with shortness of breath, lightheadedness, and fatigue.  She has no associated chest pain.  The patient denies any prior history of similar symptoms.  She did not take anything for it at home.  She noted that her Fitbit had recorded a heart rate in the 140s for over an hour.   Past Medical History:  Diagnosis Date  . Anxiety   . Depression   . PONV (postoperative nausea and vomiting)     Patient Active Problem List   Diagnosis Date Noted  . Tachycardia 08/26/2020  . Cholecystitis, acute 01/11/2015  . Cholelithiases     Past Surgical History:  Procedure Laterality Date  . CESAREAN SECTION N/A    X2  . CHOLECYSTECTOMY N/A 01/11/2015   Procedure: LAPAROSCOPIC CHOLECYSTECTOMY WITH INTRAOPERATIVE CHOLANGIOGRAM;  Surgeon: Tiney Rouge III, MD;  Location: [facility] ORS;  Service: General;  Laterality: N/A;    Prior to Admission medications   Medication Sig Start Date End Date Taking? Authorizing Provider  loratadine (CLARITIN) 10 MG tablet Take 10 mg by mouth daily.    [provider]  Multiple Vitamin (MULTIVITAMIN) tablet Take 1 tablet by mouth daily.    [provider]  sertraline (ZOLOFT) 50 MG tablet Take 50 mg by mouth daily.    [provider]    Allergies Patient has no known allergies.  Family History  Problem Relation Age of Onset  . Diabetes Mother   . Hypertension Mother   . Hyperlipidemia Mother   .  Heart disease Father     Social History Social History   Tobacco Use  . Smoking status: Never Smoker  . Smokeless tobacco: Never Used  Substance Use Topics  . Alcohol use: No  . Drug use: No    Review of Systems  Constitutional: No fever.  Positive for fatigue. Eyes: No redness. ENT: No sore throat. Cardiovascular: Denies chest pain. Respiratory: Positive for shortness of breath. Gastrointestinal: No vomiting or diarrhea.  Genitourinary: Negative for dysuria.  Musculoskeletal: Negative for back pain. Skin: Negative for rash. Neurological: Negative for headache.   ____________________________________________   PHYSICAL EXAM:  VITAL SIGNS: ED Triage Vitals [08/26/20 1108]  Enc Vitals Group     BP (!) 131/97     Pulse Rate 66     Resp 18     Temp 98.4 F (36.9 C)     Temp Source Oral     SpO2 100 %     Weight      Height      Head Circumference      Peak Flow      Pain Score      Pain Loc      Pain Edu?      Excl. in GC?     Constitutional: Alert and oriented.  Tired appearing but in no acute distress. Eyes: Conjunctivae are normal.  Head: Atraumatic. Nose: No congestion/rhinnorhea. Mouth/Throat: Mucous membranes are moist.   Neck: Normal range of motion.  Cardiovascular:  Tachycardic, regular rhythm. Grossly normal heart sounds.  Good peripheral circulation. Respiratory: Normal respiratory effort.  No retractions. Lungs CTAB. Gastrointestinal: No distention.  Musculoskeletal: No lower extremity edema.  Extremities warm and well perfused.  Neurologic:  Normal speech and language. No gross focal neurologic deficits are appreciated.  Skin:  Skin is warm and dry. No rash noted. Psychiatric: Mood and affect are normal. Speech and behavior are normal.  ____________________________________________   LABS (all labs ordered are listed, but only abnormal results are displayed)  Labs Reviewed  BASIC METABOLIC PANEL - Abnormal; Notable for the following  components:      Result Value   Potassium 3.3 (*)    Glucose, Bld 132 (*)    All other components within normal limits  CBC WITH DIFFERENTIAL/PLATELET - Abnormal; Notable for the following components:   WBC 13.5 (*)    RBC 5.49 (*)    Hemoglobin 17.1 (*)    HCT 47.6 (*)    Neutro Abs 9.3 (*)    Monocytes Absolute 1.1 (*)    All other components within normal limits  BRAIN NATRIURETIC PEPTIDE  D-DIMER, QUANTITATIVE  URINALYSIS, COMPLETE (UACMP) WITH MICROSCOPIC  TROPONIN I (HIGH SENSITIVITY)  TROPONIN I (HIGH SENSITIVITY)   ____________________________________________  EKG  ED ECG REPORT I, Dionne Bucy, the attending physician, personally viewed and interpreted this ECG.  Date: 08/26/2020 EKG Time: 1100 Rate: 177 Rhythm: Atrial fibrillation with RVR QRS Axis: normal Intervals: normal ST/T Wave abnormalities: Nonspecific abnormality Narrative Interpretation: Atrial fibrillation with RVR  ____________________________________________  RADIOLOGY  Chest x-ray interpreted by me shows no focal infiltrate or edema  ____________________________________________   PROCEDURES  Procedure(s) performed: No  Procedures  Critical Care performed: No ____________________________________________   INITIAL IMPRESSION / ASSESSMENT AND PLAN / ED COURSE  Pertinent labs & imaging results that were available during my care of the patient were reviewed by me and considered in my medical decision making (see chart for details).  40 year old female with PMH as noted above but no prior cardiac history presents with acute onset of palpitations, lightheadedness, and fatigue around 5 AM.  Her Fitbit noted a heart rate in the 140s.  On arrival to the ED, the heart rate was in the 170s and EKG showed atrial fibrillation with RVR.  The patient has no prior history of this.  On my exam, the patient is tired but comfortable appearing.  Her vital signs are normal except for a heart rate  around 110-120 but monitor shows a sinus rhythm at this time.  Physical exam is otherwise unremarkable.  Presentation is consistent with new onset paroxysmal atrial fibrillation, although SVT is also on the differential.  The patient has no significant chest pain or hypoxia so I have a low suspicion for PE.  We will obtain basic labs, cardiac enzymes, D-dimer, chest x-ray, give a fluid bolus, and reassess.  ----------------------------------------- 2:24 PM on 08/26/2020 -----------------------------------------  The patient has remained in sinus rhythm.  Her tachycardia has improved, however with minimal exertion such as moving around in the bed or talking, the patient becomes tachycardic.  Lab work-up is unremarkable.  Troponin and D-dimer are both negative.  The patient continues to feel quite fatigued.  I consulted Dr. Lady Gary from cardiology who recommends replete the patient's potassium, continuing fluids, and agrees with admission for further work-up.  I then consulted Dr. Joylene Igo from the hospitalist service for admission.   ____________________________________________   FINAL CLINICAL IMPRESSION(S) / ED DIAGNOSES  Final diagnoses:  Atrial fibrillation, unspecified type (  HCC)      NEW MEDICATIONS STARTED DURING THIS VISIT:  New Prescriptions   No medications on file     Note:  This document was prepared using Dragon voice recognition software and may include unintentional dictation errors.    Dionne Bucy, MD 08/26/20 1425

## 2020-08-26 NOTE — H&P (Addendum)
History and Physical    Kelly [name] GYI:[id number] DOB: [birth year] DOA: 08/26/2020  PCP: Marisue Ivan, MD   Patient coming from: Home  I have personally briefly reviewed patient's old medical records in [facility]  Chief Complaint: Palpitations  HPI: Kelly [name] is a 40 y.o. female with medical history significant for anxiety and depression who presents to the emergency room via private vehicle for evaluation of palpitations, dizziness and tingling sensation in both hands.  Patient states that his symptoms started this morning after she woke up when she felt sensation that her heart was beating very fast.  She stayed in bed and noted that her Fitbit recorded her as having exercised with heart rate in the 140s despite pain at rest.  She expected her symptoms improve at rest but when it did not she asked her husband to bring her to the emergency room. She complained of some chest discomfort and shortness of breath with exertion but denied having any nausea, no vomiting, no diaphoresis.  She denied having any abdominal pain, no constipation, no diarrhea, no headache, no blurred vision, no cough, no fever, no chills, no sore throat, no myalgias. She does not have any known cardiac history and denies caffeine or illicit drug use. Upon arrival to the ER she was found to be in A. fib with a rapid ventricular rate of 170bpm.  This was transient and patient converted to sinus rhythm after IV fluid resuscitation but is still tachycardic. Labs show sodium 140, potassium 3.1, chloride 108, bicarb 22, glucose 132, BUN 11, creatinine 0.7, calcium 9.2, BNP 92.7, troponin IV, white count 13.5, hemoglobin 17.1, hematocrit 47.6, MCV 86.7, RDW 12.6, platelet count 296, D-dimer 0.30 Chest x-ray reviewed by me shows no acute cardiopulmonary process.Findings suspicious for 1.4 cm right mid lung nodule. Further evaluation with contrast-enhanced chest CT should be performed. Twelve-lead EKG  reviewed by me shows A. fib with a rapid ventricular rate and subendocardial injury     ED Course: Patient is a 40 year old Caucasian female who presents to the ER for evaluation of palpitations and dizziness and was found to be in rapid A. fib which was transient.  She is currently in sinus rhythm but remains tachycardic.  Cardiology was consulted in the ER and he recommends observation for further evaluation.  She will be referred to observation status for further evaluation.  Review of Systems: As per HPI otherwise all other systems reviewed and negative.    Past Medical History:  Diagnosis Date  . Anxiety   . Depression   . PONV (postoperative nausea and vomiting)     Past Surgical History:  Procedure Laterality Date  . CESAREAN SECTION N/A    X2  . CHOLECYSTECTOMY N/A 01/11/2015   Procedure: LAPAROSCOPIC CHOLECYSTECTOMY WITH INTRAOPERATIVE CHOLANGIOGRAM;  Surgeon: Tiney Rouge III, MD;  Location: [facility] ORS;  Service: General;  Laterality: N/A;     reports that she has never smoked. She has never used smokeless tobacco. She reports that she does not drink alcohol and does not use drugs.  No Known Allergies  Family History  Problem Relation Age of Onset  . Diabetes Mother   . Hypertension Mother   . Hyperlipidemia Mother   . Heart disease Father       Prior to Admission medications   Medication Sig Start Date End Date Taking? Authorizing Provider  loratadine (CLARITIN) 10 MG tablet Take 10 mg by mouth at bedtime.   Yes [provider]    Physical  Exam: Vitals:   08/26/20 1230 08/26/20 1300 08/26/20 1330 08/26/20 1400  BP: 125/80 128/85 131/71 (!) 117/91  Pulse: 94 100 94 90  Resp: (!) 9 13 14 10   Temp:      TempSrc:      SpO2: 99% 100% 98% 100%  Weight:      Height:         Vitals:   08/26/20 1230 08/26/20 1300 08/26/20 1330 08/26/20 1400  BP: 125/80 128/85 131/71 (!) 117/91  Pulse: 94 100 94 90  Resp: (!) 9 13 14 10   Temp:      TempSrc:       SpO2: 99% 100% 98% 100%  Weight:      Height:          Constitutional: Alert and oriented x 3. Not in any apparent distress.  Appears weak and tired HEENT:      Head: Normocephalic and atraumatic.         Eyes: PERLA, EOMI, Conjunctivae are normal. Sclera is non-icteric.       Mouth/Throat: Mucous membranes are moist.       Neck: Supple with no signs of meningismus. Cardiovascular: Regular rate and rhythm, tachycardic. No murmurs, gallops, or rubs. 2+ symmetrical distal pulses are present . No JVD. No LE edema Respiratory: Respiratory effort normal .Lungs sounds clear bilaterally. No wheezes, crackles, or rhonchi.  Gastrointestinal: Soft, non tender, and non distended with positive bowel sounds.  Genitourinary: No CVA tenderness. Musculoskeletal: Nontender with normal range of motion in all extremities. No cyanosis, or erythema of extremities. Neurologic:  Face is symmetric. Moving all extremities. No gross focal neurologic deficits  Skin: Skin is warm, dry.  No rash or ulcers  Psychiatric: Mood and affect are normal   Labs on Admission: I have personally reviewed following labs and imaging studies  CBC: Recent Labs  Lab 08/26/20 1113  WBC 13.5*  NEUTROABS 9.3*  HGB 17.1*  HCT 47.6*  MCV 86.7  PLT 296   Basic Metabolic Panel: Recent Labs  Lab 08/26/20 1113  NA 140  K 3.3*  CL 108  CO2 22  GLUCOSE 132*  BUN 11  CREATININE 0.77  CALCIUM 9.2   GFR: Estimated Creatinine Clearance: 119.4 mL/min (by C-G formula based on SCr of 0.77 mg/dL). Liver Function Tests: No results for input(s): AST, ALT, ALKPHOS, BILITOT, PROT, ALBUMIN in the last 168 hours. No results for input(s): LIPASE, AMYLASE in the last 168 hours. No results for input(s): AMMONIA in the last 168 hours. Coagulation Profile: No results for input(s): INR, PROTIME in the last 168 hours. Cardiac Enzymes: No results for input(s): CKTOTAL, CKMB, CKMBINDEX, TROPONINI in the last 168 hours. BNP (last 3  results) No results for input(s): PROBNP in the last 8760 hours. HbA1C: No results for input(s): HGBA1C in the last 72 hours. CBG: No results for input(s): GLUCAP in the last 168 hours. Lipid Profile: No results for input(s): CHOL, HDL, LDLCALC, TRIG, CHOLHDL, LDLDIRECT in the last 72 hours. Thyroid Function Tests: No results for input(s): TSH, T4TOTAL, FREET4, T3FREE, THYROIDAB in the last 72 hours. Anemia Panel: No results for input(s): VITAMINB12, FOLATE, FERRITIN, TIBC, IRON, RETICCTPCT in the last 72 hours. Urine analysis: No results found for: COLORURINE, APPEARANCEUR, LABSPEC, PHURINE, GLUCOSEU, HGBUR, BILIRUBINUR, KETONESUR, PROTEINUR, UROBILINOGEN, NITRITE, LEUKOCYTESUR  Radiological Exams on Admission: DG Chest Portable 1 View  Result Date: 08/26/2020 CLINICAL DATA:  New onset atrial fibrillation EXAM: PORTABLE CHEST 1 VIEW COMPARISON:  01/11/2015 FINDINGS: Cardiomediastinal silhouette and pulmonary vasculature  are within normal limits. No focal airspace opacity to indicate pneumonia. Findings suspicious for right mid lung nodule measuring 1.4 cm. Lungs otherwise clear. IMPRESSION: 1. No acute cardiopulmonary process. 2. Findings suspicious for 1.4 cm right mid lung nodule. Further evaluation with contrast-enhanced chest CT should be performed. Electronically Signed   By: Acquanetta Belling M.D.   On: 08/26/2020 11:52     Assessment/Plan Principal Problem:   Tachycardia Active Problems:   Hypokalemia     Tachycardia Patient presents to the ER for evaluation of dizziness and palpitations and was found to be in rapid A. fib which was transient and she is currently in sinus tachycardia ??  Lone A. Fib Patient does not have a known history of atrial fibrillation. Place patient on cardiac monitoring for the next 24 hours and she may need an event monitor as an outpatient We will obtain a 2D echocardiogram to assess LVEF We will obtain TSH levels Start patient on low-dose  beta-blocker Consult cardiology   Hypokalemia Supplement potassium Obtain magnesium levels    Pulmonary nodule Incidental finding on chest x-ray  Findings suspicious for 1.4 cm right mid lung nodule. Further evaluation with contrast-enhanced chest CT should be performed.    DVT prophylaxis: Lovenox Code Status: full code Family Communication: Greater than 50% of time was spent discussing patient's condition and plan of care with her and her husband at the bedside.  All questions and concerns have been addressed.  They verbalized understanding and agree with the plan. Disposition Plan: Back to previous home environment Consults called: Cardiology Status: Observation    Kelly Slater MD [facility]     08/26/2020, 3:08 PM

## 2020-08-26 NOTE — ED Triage Notes (Signed)
First Nurse NOte:  Arrives with spouse c/o dizziness, hands tingling, and elevated heart rate today.   Patient is AAOx3.  Skin warm and dry. NAD

## 2020-08-26 NOTE — ED Triage Notes (Signed)
Pt in with c/o tachycardia and dizziness that began 4-5 hrs ago. States she was lying in bed, and happened to look at her FitBit, noticed her HR was up to 140's. Denies any cp or sob, still having dizziness. No known cardiac hx

## 2020-08-27 DIAGNOSIS — I480 Paroxysmal atrial fibrillation: Secondary | ICD-10-CM | POA: Diagnosis not present

## 2020-08-27 DIAGNOSIS — E860 Dehydration: Secondary | ICD-10-CM | POA: Diagnosis not present

## 2020-08-27 DIAGNOSIS — E876 Hypokalemia: Secondary | ICD-10-CM | POA: Diagnosis not present

## 2020-08-27 LAB — BASIC METABOLIC PANEL
Anion gap: 4 — ABNORMAL LOW (ref 5–15)
BUN: 8 mg/dL (ref 6–20)
CO2: 24 mmol/L (ref 22–32)
Calcium: 8.3 mg/dL — ABNORMAL LOW (ref 8.9–10.3)
Chloride: 112 mmol/L — ABNORMAL HIGH (ref 98–111)
Creatinine, Ser: 0.58 mg/dL (ref 0.44–1.00)
GFR, Estimated: >60 mL/min (ref >60)
Glucose, Bld: 101 mg/dL — ABNORMAL HIGH (ref 70–99)
Potassium: 3.6 mmol/L (ref 3.5–5.1)
Sodium: 140 mmol/L (ref 135–145)

## 2020-08-27 LAB — ECHOCARDIOGRAM COMPLETE
AR max vel: 1.68 cm2
AV Peak grad: 11.2 mmHg
Ao pk vel: 1.67 m/s
Area-P 1/2: 5.02 cm2
Height: 70 in
S' Lateral: 3 cm
Weight: 3440 oz

## 2020-08-27 LAB — CBC
HCT: 37.2 % (ref 36.0–46.0)
Hemoglobin: 13 g/dL (ref 12.0–15.0)
MCH: 31.2 pg (ref 26.0–34.0)
MCHC: 34.9 g/dL (ref 30.0–36.0)
MCV: 89.2 fL (ref 80.0–100.0)
Platelets: 187 10*3/uL (ref 150–400)
RBC: 4.17 MIL/uL (ref 3.87–5.11)
RDW: 12.9 % (ref 11.5–15.5)
WBC: 7.9 10*3/uL (ref 4.0–10.5)
nRBC: 0 % (ref 0.0–0.2)

## 2020-08-27 MED ORDER — POTASSIUM CHLORIDE CRYS ER 20 MEQ PO TBCR
40.0000 meq | EXTENDED_RELEASE_TABLET | Freq: Once | ORAL | Status: AC
  Administered 2020-08-27: 40 meq via ORAL
  Filled 2020-08-27: qty 2

## 2020-08-27 MED ORDER — ASPIRIN 81 MG PO TBEC: 81.0000 mg | DELAYED_RELEASE_TABLET | Freq: Every day | ORAL | 0 refills | Status: DC

## 2020-08-27 MED ORDER — POTASSIUM CHLORIDE CRYS ER 20 MEQ PO TBCR: 20.0000 meq | EXTENDED_RELEASE_TABLET | Freq: Every day | ORAL | 0 refills | Status: DC

## 2020-08-27 MED ORDER — METOPROLOL TARTRATE 25 MG PO TABS: 25.0000 mg | ORAL_TABLET | Freq: Two times a day (BID) | ORAL | 0 refills | Status: DC

## 2020-08-27 NOTE — Plan of Care (Signed)

## 2020-08-27 NOTE — Discharge Summary (Signed)
[facility] [place] [facility]   PATIENT NAME: Kelly [name]    MR#:  [id number]  DATE OF BIRTH:  04-30-1981  DATE OF ADMISSION:  08/26/2020 ADMITTING PHYSICIAN: Lucile Shutters, MD  DATE OF DISCHARGE: 08/27/2020 10:39 AM  PRIMARY CARE PHYSICIAN: Marisue Ivan, MD    ADMISSION DIAGNOSIS:  Tachycardia [R00.0] Atrial fibrillation, unspecified type (HCC) [I48.91]  DISCHARGE DIAGNOSIS:  Principal Problem:   Tachycardia Active Problems:   Hypokalemia   SECONDARY DIAGNOSIS:   Past Medical History:  Diagnosis Date  . Anxiety   . Depression   . PONV (postoperative nausea and vomiting)     HOSPITAL COURSE:   1.  Paroxysmal atrial fibrillation the patient came in with palpitations and heart rate was up in the 170s.  The patient converted over to normal sinus rhythm.  Chads 2 vas score of 1.  Hold on full anticoagulation at this time.  Patient will be continued on the metoprolol 25 mg twice a day that she was started on here.  She will follow-up at the [facility] clinic for a heart monitor.  Echocardiogram showed a normal ejection fraction and normal atrium size.  TSH normal range 2.  Hypokalemia potassium was replaced on admission and again prior to discharge and will be replaced upon disposition home.  Magnesium normal range. 3.  Dehydration on presentation with a hemoglobin of 17.1.  With IV fluids hemoglobin did come down to 13.  Recommend checking hemoglobin as outpatient.  No signs of bleeding except for her being on her menstrual cycle. 4.  Chest x-ray mention the possibility of a nodule but CT scan of the chest was negative no lung nodule seen.  DISCHARGE CONDITIONS:   Satisfactory  CONSULTS OBTAINED:  Cardiology  DRUG ALLERGIES:  No Known Allergies  DISCHARGE MEDICATIONS:   Allergies as of 08/27/2020   No Known Allergies     Medication List    TAKE these medications   aspirin 81 MG EC tablet Take 1 tablet (81 mg total) by mouth daily.  Swallow whole.   loratadine 10 MG tablet Commonly known as: CLARITIN Take 10 mg by mouth at bedtime.   metoprolol tartrate 25 MG tablet Commonly known as: LOPRESSOR Take 1 tablet (25 mg total) by mouth 2 (two) times daily.   potassium chloride SA 20 MEQ tablet Commonly known as: KLOR-CON Take 1 tablet (20 mEq total) by mouth daily. Start taking on: August 28, 2020        DISCHARGE INSTRUCTIONS:   Follow-up PMD 5 days Follow-up cardiology in a few weeks  If you experience worsening of your admission symptoms, develop shortness of breath, life threatening emergency, suicidal or homicidal thoughts you must seek medical attention immediately by calling 911 or calling your MD immediately  if symptoms less severe.  You Must read complete instructions/literature along with all the possible adverse reactions/side effects for all the Medicines you take and that have been prescribed to you. Take any new Medicines after you have completely understood and accept all the possible adverse reactions/side effects.   Please note  You were cared for by a hospitalist during your hospital stay. If you have any questions about your discharge medications or the care you received while you were in the hospital after you are discharged, you can call the unit and asked to speak with the hospitalist on call if the hospitalist that took care of you is not available. Once you are discharged, your primary care physician will handle any further medical  issues. Please note that NO REFILLS for any discharge medications will be authorized once you are discharged, as it is imperative that you return to your primary care physician (or establish a relationship with a primary care physician if you do not have one) for your aftercare needs so that they can reassess your need for medications and monitor your lab values.    Today   CHIEF COMPLAINT:   Chief Complaint  Patient presents with  . Tachycardia  . Near  Syncope    HISTORY OF PRESENT ILLNESS:  Kelly [name]  is a 40 y.o. female came in with palpitations and found to be tachycardic and in rapid A. fib   VITAL SIGNS:  Blood pressure 116/73, pulse 75, temperature 98 F (36.7 C), temperature source Oral, resp. rate 18, height 5\' 10"  (1.778 m), weight 95.4 kg, last menstrual period 08/26/2020, SpO2 99 %.  I/O:    Intake/Output Summary (Last 24 hours) at 08/27/2020 1913 Last data filed at 08/27/2020 0945 Gross per 24 hour  Intake 1949.52 ml  Output -  Net 1949.52 ml    PHYSICAL EXAMINATION:  GENERAL:  40 y.o.-year-old patient lying in the bed with no acute distress.  EYES: Pupils equal, round, reactive to light and accommodation. No scleral icterus. Extraocular muscles intact.  HEENT: Head atraumatic, normocephalic. Oropharynx and nasopharynx clear.   LUNGS: Normal breath sounds bilaterally, no wheezing, rales,rhonchi or crepitation. No use of accessory muscles of respiration.  CARDIOVASCULAR: S1, S2 normal. No murmurs, rubs, or gallops.  ABDOMEN: Soft, non-tender, non-distended.  EXTREMITIES: No pedal edema.  NEUROLOGIC: Cranial nerves II through XII are intact. Muscle strength 5/5 in all extremities. Sensation intact. Gait not checked.  PSYCHIATRIC: The patient is alert and oriented x 3.  SKIN: No obvious rash, lesion, or ulcer.   DATA REVIEW:   CBC Recent Labs  Lab 08/27/20 0516  WBC 7.9  HGB 13.0  HCT 37.2  PLT 187    Chemistries  Recent Labs  Lab 08/26/20 1602 08/27/20 0516  NA  --  140  K  --  3.6  CL  --  112*  CO2  --  24  GLUCOSE  --  101*  BUN  --  8  CREATININE  --  0.58  CALCIUM  --  8.3*  MG 2.0  --      Microbiology Results  Results for orders placed or performed during the hospital encounter of 08/26/20  Resp Panel by RT-PCR (Flu A&B, Covid) Nasopharyngeal Swab     Status: None   Collection Time: 08/26/20  4:02 PM   Specimen: Nasopharyngeal Swab; Nasopharyngeal(NP) swabs in vial transport medium   Result Value Ref Range Status   SARS Coronavirus 2 by RT PCR NEGATIVE NEGATIVE Final    Comment: (NOTE) SARS-CoV-2 target nucleic acids are NOT DETECTED.  The SARS-CoV-2 RNA is generally detectable in upper respiratory specimens during the acute phase of infection. The lowest concentration of SARS-CoV-2 viral copies this assay can detect is 138 copies/mL. A negative result does not preclude SARS-Cov-2 infection and should not be used as the sole basis for treatment or other patient management decisions. A negative result may occur with  improper specimen collection/handling, submission of specimen other than nasopharyngeal swab, presence of viral mutation(s) within the areas targeted by this assay, and inadequate number of viral copies(<138 copies/mL). A negative result must be combined with clinical observations, patient history, and epidemiological information. The expected result is Negative.  Fact Sheet for Patients:  08/28/20  Fact Sheet for Healthcare Providers:  SeriousBroker.it  This test is no t yet approved or cleared by the [place] FDA and  has been authorized for detection and/or diagnosis of SARS-CoV-2 by FDA under an Emergency Use Authorization (EUA). This EUA will remain  in effect (meaning this test can be used) for the duration of the COVID-19 declaration under Section 564(b)(1) of the Act, 21 U.S.C.section 360bbb-3(b)(1), unless the authorization is terminated  or revoked sooner.       Influenza A by PCR NEGATIVE NEGATIVE Final   Influenza B by PCR NEGATIVE NEGATIVE Final    Comment: (NOTE) The Xpert Xpress SARS-CoV-2/FLU/RSV plus assay is intended as an aid in the diagnosis of influenza from Nasopharyngeal swab specimens and should not be used as a sole basis for treatment. Nasal washings and aspirates are unacceptable for Xpert Xpress SARS-CoV-2/FLU/RSV testing.  Fact Sheet for  Patients: BloggerCourse.com  Fact Sheet for Healthcare Providers: SeriousBroker.it  This test is not yet approved or cleared by the [place] FDA and has been authorized for detection and/or diagnosis of SARS-CoV-2 by FDA under an Emergency Use Authorization (EUA). This EUA will remain in effect (meaning this test can be used) for the duration of the COVID-19 declaration under Section 564(b)(1) of the Act, 21 U.S.C. section 360bbb-3(b)(1), unless the authorization is terminated or revoked.  Performed at [facility], [address], [place], [place] [address]     RADIOLOGY:  CT CHEST W CONTRAST  Result Date: 08/26/2020 CLINICAL DATA:  Shortness of breath. EXAM: CT CHEST WITH CONTRAST TECHNIQUE: Multidetector CT imaging of the chest was performed during intravenous contrast administration. CONTRAST:  29mL OMNIPAQUE IOHEXOL 300 MG/ML  SOLN COMPARISON:  Radiograph of same day. FINDINGS: Cardiovascular: No significant vascular findings. Normal heart size. No pericardial effusion. Mediastinum/Nodes: No enlarged mediastinal, hilar, or axillary lymph nodes. Thyroid gland, trachea, and esophagus demonstrate no significant findings. Lungs/Pleura: Lungs are clear. No pleural effusion or pneumothorax. Upper Abdomen: No acute abnormality. Musculoskeletal: No chest wall abnormality. No acute or significant osseous findings. IMPRESSION: No definite abnormality seen in the chest. Electronically Signed   By: Lupita Raider M.D.   On: 08/26/2020 15:59   DG Chest Portable 1 View  Result Date: 08/26/2020 CLINICAL DATA:  New onset atrial fibrillation EXAM: PORTABLE CHEST 1 VIEW COMPARISON:  01/11/2015 FINDINGS: Cardiomediastinal silhouette and pulmonary vasculature are within normal limits. No focal airspace opacity to indicate pneumonia. Findings suspicious for right mid lung nodule measuring 1.4 cm. Lungs otherwise clear. IMPRESSION: 1. No acute  cardiopulmonary process. 2. Findings suspicious for 1.4 cm right mid lung nodule. Further evaluation with contrast-enhanced chest CT should be performed. Electronically Signed   By: Acquanetta Belling M.D.   On: 08/26/2020 11:52   ECHOCARDIOGRAM COMPLETE  Result Date: 08/27/2020    ECHOCARDIOGRAM REPORT   Patient Name:   Kelly [name] Date of Exam: 08/26/2020 Medical Rec #:  [id number]          Height:       70.0 in Accession #:    [id number]         Weight:       215.0 lb Date of Birth:  15-Mar-1981          BSA:          2.152 m Patient Age:    39 years           BP:           127/73 mmHg Patient Gender:  F                  HR:           88 bpm. Exam Location:  [facility] Procedure: 2D Echo, Cardiac Doppler and Color Doppler Indications:     Chest Pain R07.9  History:         Patient has no prior history of Echocardiogram examinations.  Sonographer:     Neysa Bonito Roar Referring Phys:  [id number] AGBATA Diagnosing Phys: Harold Hedge MD IMPRESSIONS  1. Left ventricular ejection fraction, by estimation, is 55 to 60%. The left ventricle has normal function. The left ventricle has no regional wall motion abnormalities. Left ventricular diastolic parameters were normal.  2. Right ventricular systolic function is normal. The right ventricular size is normal.  3. The mitral valve is grossly normal. No evidence of mitral valve regurgitation.  4. The aortic valve is tricuspid. Aortic valve regurgitation is not visualized. FINDINGS  Left Ventricle: Left ventricular ejection fraction, by estimation, is 55 to 60%. The left ventricle has normal function. The left ventricle has no regional wall motion abnormalities. The left ventricular internal cavity size was normal in size. There is  no left ventricular hypertrophy. Left ventricular diastolic parameters were normal. Right Ventricle: The right ventricular size is normal. No increase in right ventricular wall thickness. Right ventricular systolic function is normal. Left Atrium:  Left atrial size was normal in size. Right Atrium: Right atrial size was normal in size. Pericardium: There is no evidence of pericardial effusion. Mitral Valve: The mitral valve is grossly normal. No evidence of mitral valve regurgitation. Tricuspid Valve: The tricuspid valve is grossly normal. Tricuspid valve regurgitation is trivial. Aortic Valve: The aortic valve is tricuspid. Aortic valve regurgitation is not visualized. Aortic valve peak gradient measures 11.2 mmHg. Pulmonic Valve: The pulmonic valve was grossly normal. Pulmonic valve regurgitation is trivial. Aorta: The aortic root is normal in size and structure. IAS/Shunts: The atrial septum is grossly normal.  LEFT VENTRICLE PLAX 2D LVIDd:         4.12 cm  Diastology LVIDs:         3.00 cm  LV e' medial:    10.80 cm/s LV PW:         0.86 cm  LV E/e' medial:  8.9 LV IVS:        0.96 cm  LV e' lateral:   12.50 cm/s LVOT diam:     1.80 cm  LV E/e' lateral: 7.7 LVOT Area:     2.54 cm  RIGHT VENTRICLE RV Mid diam:    2.61 cm RV S prime:     15.20 cm/s TAPSE (M-mode): 2.2 cm LEFT ATRIUM             Index       RIGHT ATRIUM           Index LA diam:        3.90 cm 1.81 cm/m  RA Area:     11.40 cm LA Vol (A2C):   47.9 ml 22.25 ml/m RA Volume:   21.80 ml  10.13 ml/m LA Vol (A4C):   45.4 ml 21.09 ml/m LA Biplane Vol: 47.2 ml 21.93 ml/m  AORTIC VALVE                PULMONIC VALVE AV Area (Vmax): 1.68 cm    PV Vmax:        1.12 m/s AV Vmax:        167.00 cm/s  PV Peak grad:   5.0 mmHg AV Peak Grad:   11.2 mmHg   RVOT Peak grad: 5 mmHg LVOT Vmax:      110.00 cm/s  AORTA Ao Root diam: 2.40 cm MITRAL VALVE MV Area (PHT): 5.02 cm    SHUNTS MV Decel Time: 151 msec    Systemic Diam: 1.80 cm MV E velocity: 95.70 cm/s MV A velocity: 83.70 cm/s MV E/A ratio:  1.14 MV A Prime:    12.1 cm/s Harold HedgeKenneth Fath MD Electronically signed by Harold HedgeKenneth Fath MD Signature Date/Time: 08/27/2020/9:46:52 AM    Final     Management plans discussed with the patient, and she is in  agreement.  CODE STATUS:  Code Status History    Date Active Date Inactive Code Status Order ID Comments User Context   08/26/2020 1458 08/27/2020 1624 Full Code [id number]  Lucile ShuttersAgbata, Tochukwu, MD ED   01/11/2015 1533 01/14/2015 1742 Full Code [id number]  Salley HewsEly, Ralph III, MD Inpatient   01/11/2015 0606 01/11/2015 1533 Full Code [id number]  Ida RogueLundquist, Christopher, MD ED   Advance Care Planning Activity    Questions for Most Recent Historical Code Status (Order [id number])       TOTAL TIME TAKING CARE OF THIS PATIENT: 32 minutes.    Alford Highlandichard Wieting M.D on 08/27/2020 at 7:13 PM  Between 7am to 6pm - Pager - [phone]  After 6pm go to [email or web address] - password [email or web address]  [facility]  CC: Primary care physician; Marisue IvanLinthavong, Kanhka, MD

## 2020-08-27 NOTE — Progress Notes (Signed)
[facility] Cardiology    SUBJECTIVE: Ms. Rials is a 39 year old female with a past medical history significant for obesity who presented to the ED on 08/26/20 for an acute onset of palpitations with associated shortness of breath and lightheadedness.  She was noted to be in new onset atrial fibrillation.  She spontaneously converted back to NSR on normal saline and potassium supplementation.  Other workup was significant for potassium of 3.3, high sensitivity troponin of 4, BNP of 92, and D-dimer negative.  Chest xray was negative for any acute process but did revealed a 1.4cm mid right lobe lung nodule.    08/26/20: Ms. Hession admits to generalized fatigue and feeling worn out, but denies any recurrent episodes of atrial fibrillation.  She denies any history of alcohol or illicit drug abuse.  She has no prior history of OSA and denies snoring or apneic episodes.  She also denies chest pain, chest pressure, shortness of breath, lower extremity swelling, orthopnea, PND, or syncopal/presyncopal episodes.  She has a strong family history of heart disease.   08/27/20: Ms. Potocki reports significant improvement in her symptoms.  She continues to experience an occasional "funny sensation" in her chest, but denies any recurrent heart racing.  She denies chest pain, chest pressure, shortness of breath, lower extremity swelling, orthopnea, PND, or syncopal/presyncopal episodes.     Vitals:   08/26/20 1824 08/26/20 1849 08/27/20 0330 08/27/20 0743  BP: 107/88 (!) 143/90 127/83 116/73  Pulse: 89 88 82 75  Resp: 13 17 19 18   Temp:  98.4 F (36.9 C) (!) 97.4 F (36.3 C) 98 F (36.7 C)  TempSrc:  Oral Axillary Oral  SpO2: 100% 100% 100% 99%  Weight:  99.7 kg 95.4 kg   Height:  5\' 10"  (1.778 m)      No intake or output data in the 24 hours ending 08/27/20 0802    PHYSICAL EXAM  General: Well developed, well nourished, in no acute distress HEENT:  Normocephalic and atramatic Neck:  No JVD.  Lungs: Clear  bilaterally to auscultation and percussion. Heart: HRRR . Normal S1 and S2 without gallops or murmurs.  Abdomen: Bowel sounds are positive, abdomen soft and non-tender  Msk:  Back normal.  Normal strength and tone for age. Extremities: No clubbing, cyanosis or edema.   Neuro: Alert and oriented X 3. Psych:  Good affect, responds appropriately   LABS: Basic Metabolic Panel: Recent Labs    08/26/20 1113 08/26/20 1602 08/27/20 0516  NA 140  --  140  K 3.3*  --  3.6  CL 108  --  112*  CO2 22  --  24  GLUCOSE 132*  --  101*  BUN 11  --  8  CREATININE 0.77  --  0.58  CALCIUM 9.2  --  8.3*  MG  --  2.0  --    Liver Function Tests: No results for input(s): AST, ALT, ALKPHOS, BILITOT, PROT, ALBUMIN in the last 72 hours. No results for input(s): LIPASE, AMYLASE in the last 72 hours. CBC: Recent Labs    08/26/20 1113 08/27/20 0516  WBC 13.5* 7.9  NEUTROABS 9.3*  --   HGB 17.1* 13.0  HCT 47.6* 37.2  MCV 86.7 89.2  PLT 296 187   Cardiac Enzymes: No results for input(s): CKTOTAL, CKMB, CKMBINDEX, TROPONINI in the last 72 hours. BNP: Invalid input(s): POCBNP D-Dimer: Recent Labs    08/26/20 1113  DDIMER 0.30   Hemoglobin A1C: No results for input(s): HGBA1C in the  last 72 hours. Fasting Lipid Panel: No results for input(s): CHOL, HDL, LDLCALC, TRIG, CHOLHDL, LDLDIRECT in the last 72 hours. Thyroid Function Tests: Recent Labs    08/26/20 1602  TSH 2.102   Anemia Panel: No results for input(s): VITAMINB12, FOLATE, FERRITIN, TIBC, IRON, RETICCTPCT in the last 72 hours.  CT CHEST W CONTRAST  Result Date: 08/26/2020 CLINICAL DATA:  Shortness of breath. EXAM: CT CHEST WITH CONTRAST TECHNIQUE: Multidetector CT imaging of the chest was performed during intravenous contrast administration. CONTRAST:  64mL OMNIPAQUE IOHEXOL 300 MG/ML  SOLN COMPARISON:  Radiograph of same day. FINDINGS: Cardiovascular: No significant vascular findings. Normal heart size. No pericardial  effusion. Mediastinum/Nodes: No enlarged mediastinal, hilar, or axillary lymph nodes. Thyroid gland, trachea, and esophagus demonstrate no significant findings. Lungs/Pleura: Lungs are clear. No pleural effusion or pneumothorax. Upper Abdomen: No acute abnormality. Musculoskeletal: No chest wall abnormality. No acute or significant osseous findings. IMPRESSION: No definite abnormality seen in the chest. Electronically Signed   By: Lupita Raider M.D.   On: 08/26/2020 15:59   DG Chest Portable 1 View  Result Date: 08/26/2020 CLINICAL DATA:  New onset atrial fibrillation EXAM: PORTABLE CHEST 1 VIEW COMPARISON:  01/11/2015 FINDINGS: Cardiomediastinal silhouette and pulmonary vasculature are within normal limits. No focal airspace opacity to indicate pneumonia. Findings suspicious for right mid lung nodule measuring 1.4 cm. Lungs otherwise clear. IMPRESSION: 1. No acute cardiopulmonary process. 2. Findings suspicious for 1.4 cm right mid lung nodule. Further evaluation with contrast-enhanced chest CT should be performed. Electronically Signed   By: Acquanetta Belling M.D.   On: 08/26/2020 11:52     Echo: Pending   TELEMETRY: Normal sinus rhythm, rate in the 60s-70s   ASSESSMENT AND PLAN:  Principal Problem:   Tachycardia Active Problems:   Hypokalemia    1.  New onset atrial fibrillation              -Presented to the ED in new onset atrial fibrillation RVR, rate in the 170s             -Converted to NSR on normal saline and potassium supplementation (3.3 on admission, 3.6 this morning)             -Echocardiogram pending              -With a CHA2D2s VASc score of 1, for female gender, will defer anticoagulation at this time as she spontaneously converted to NSR  -Okay to remain on metoprolol 25mg  BID with close monitoring of BP              -She is optimized for discharge from a cardiac standpoint; recommend obtaining 2 week Holter monitor prior to discharge   -Follow up at [facility] within  4 weeks of discharge   2.  Hypokalemia              -Continue potassium supplementation; would recommend discharging on potassium [facility]   3.  Family history of premature CAD             -Consider outpatient stress test for strong family history   The history, physical exam findings, and plan of care were all discussed with Dr. , and all decision making was made in collaboration.   Harold Hedge  PA-C 08/27/2020 8:02 AM

## 2020-08-27 NOTE — Plan of Care (Signed)
Pt ready for discharge. IV and tele removed.  Discharge instructions reviewed with patient  Time allowed for questions and concerns Verbalizes an understanding on discharge instructions.  Denies any additional wants or needs at this time Awaiting patient transport home   Problem: Education: Goal: Knowledge of General Education information will improve Description: Including pain rating scale, medication(s)/side effects and non-pharmacologic comfort measures Outcome: Adequate for Discharge   Problem: Health Behavior/Discharge Planning: Goal: Ability to manage health-related needs will improve Outcome: Adequate for Discharge   Problem: Clinical Measurements: Goal: Ability to maintain clinical measurements within normal limits will improve Outcome: Adequate for Discharge Goal: Will remain free from infection Outcome: Adequate for Discharge Goal: Diagnostic test results will improve Outcome: Adequate for Discharge Goal: Respiratory complications will improve Outcome: Adequate for Discharge Goal: Cardiovascular complication will be avoided Outcome: Adequate for Discharge   Problem: Activity: Goal: Risk for activity intolerance will decrease Outcome: Adequate for Discharge   Problem: Nutrition: Goal: Adequate nutrition will be maintained Outcome: Adequate for Discharge   Problem: Coping: Goal: Level of anxiety will decrease Outcome: Adequate for Discharge   Problem: Elimination: Goal: Will not experience complications related to bowel motility Outcome: Adequate for Discharge Goal: Will not experience complications related to urinary retention Outcome: Adequate for Discharge   Problem: Pain Managment: Goal: General experience of comfort will improve Outcome: Adequate for Discharge   Problem: Safety: Goal: Ability to remain free from injury will improve Outcome: Adequate for Discharge   Problem: Skin Integrity: Goal: Risk for impaired skin integrity will  decrease Outcome: Adequate for Discharge

## 2020-08-27 NOTE — Discharge Instructions (Signed)
Get heart monitor at [facility] office upon discharge  Atrial Fibrillation  Atrial fibrillation is a type of heartbeat that is irregular or fast. If you have this condition, your heart beats without any order. This makes it hard for your heart to pump blood in a normal way. Atrial fibrillation may come and go, or it may become a long-lasting problem. If this condition is not treated, it can put you at higher risk for stroke, heart failure, and other heart problems. What are the causes? This condition may be caused by diseases that damage the heart. They include:  High blood pressure.  Heart failure.  Heart valve disease.  Heart surgery. Other causes include:  Diabetes.  Thyroid disease.  Being overweight.  Kidney disease. Sometimes the cause is not known. What increases the risk? You are more likely to develop this condition if:  You are older.  You smoke.  You exercise often and very hard.  You have a family history of this condition.  You are a man.  You use drugs.  You drink a lot of alcohol.  You have lung conditions, such as emphysema, pneumonia, or COPD.  You have sleep apnea. What are the signs or symptoms? Common symptoms of this condition include:  A feeling that your heart is beating very fast.  Chest pain or discomfort.  Feeling short of breath.  Suddenly feeling light-headed or weak.  Getting tired easily during activity.  Fainting.  Sweating. In some cases, there are no symptoms. How is this treated? Treatment for this condition depends on underlying conditions and how you feel when you have atrial fibrillation. They include:  Medicines to: ? Prevent blood clots. ? Treat heart rate or heart rhythm problems.  Using devices, such as a pacemaker, to correct heart rhythm problems.  Doing surgery to remove the part of the heart that sends bad signals.  Closing an area where clots can form in the heart (left atrial appendage). In some  cases, your doctor will treat other underlying conditions. Follow these instructions at home: Medicines  Take over-the-counter and prescription medicines only as told by your doctor.  Do not take any new medicines without first talking to your doctor.  If you are taking blood thinners: ? Talk with your doctor before you take any medicines that have aspirin or NSAIDs, such as ibuprofen, in them. ? Take your medicine exactly as told by your doctor. Take it at the same time each day. ? Avoid activities that could hurt or bruise you. Follow instructions about how to prevent falls. ? Wear a bracelet that says you are taking blood thinners. Or, carry a card that lists what medicines you take. Lifestyle  Do not use any products that have nicotine or tobacco in them. These include cigarettes, e-cigarettes, and chewing tobacco. If you need help quitting, ask your doctor.  Eat heart-healthy foods. Talk with your doctor about the right eating plan for you.  Exercise regularly as told by your doctor.  Do not drink alcohol.  Lose weight if you are overweight.  Do not use drugs, including cannabis.      General instructions  If you have a condition that causes breathing to stop for a short period of time (apnea), treat it as told by your doctor.  Keep a healthy weight. Do not use diet pills unless your doctor says they are safe for you. Diet pills may make heart problems worse.  Keep all follow-up visits as told by your doctor. This  is important. Contact a doctor if:  You notice a change in the speed, rhythm, or strength of your heartbeat.  You are taking a blood-thinning medicine and you get more bruising.  You get tired more easily when you move or exercise.  You have a sudden change in weight. Get help right away if:  You have pain in your chest or your belly (abdomen).  You have trouble breathing.  You have side effects of blood thinners, such as blood in your vomit, poop  (stool), or pee (urine), or bleeding that cannot stop.  You have any signs of a stroke. "BE FAST" is an easy way to remember the main warning signs: ? B - Balance. Signs are dizziness, sudden trouble walking, or loss of balance. ? E - Eyes. Signs are trouble seeing or a change in how you see. ? F - Face. Signs are sudden weakness or loss of feeling in the face, or the face or eyelid drooping on one side. ? A - Arms. Signs are weakness or loss of feeling in an arm. This happens suddenly and usually on one side of the body. ? S - Speech. Signs are sudden trouble speaking, slurred speech, or trouble understanding what people say. ? T - Time. Time to call emergency services. Write down what time symptoms started.  You have other signs of a stroke, such as: ? A sudden, very bad headache with no known cause. ? Feeling like you may vomit (nausea). ? Vomiting. ? A seizure. These symptoms may be an emergency. Do not wait to see if the symptoms will go away. Get medical help right away. Call your local emergency services (911 in the [place] Do not drive yourself to the hospital.   Summary  Atrial fibrillation is a type of heartbeat that is irregular or fast.  You are at higher risk of this condition if you smoke, are older, have diabetes, or are overweight.  Follow your doctor's instructions about medicines, diet, exercise, and follow-up visits.  Get help right away if you have signs or symptoms of a stroke.  Get help right away if you cannot catch your breath, or you have chest pain or discomfort. This information is not intended to replace advice given to you by your health care provider. Make sure you discuss any questions you have with your health care provider. Document Revised: 12/14/2018 Document Reviewed: 12/14/2018 Elsevier Patient Education  2021 [employer].

## 2020-09-26 DIAGNOSIS — I493 Ventricular premature depolarization: Secondary | ICD-10-CM | POA: Insufficient documentation

## 2021-02-22 ENCOUNTER — Emergency Department
Admission: EM | Admit: 2021-02-22 | Discharge: 2021-02-22 | Disposition: A | Discharge status: Home / Self Care | Payer: No Typology Code available for payment source | Attending: Emergency Medicine | Admitting: Emergency Medicine

## 2021-02-22 ENCOUNTER — Emergency Department: Payer: No Typology Code available for payment source

## 2021-02-22 ENCOUNTER — Other Ambulatory Visit: Payer: Self-pay

## 2021-02-22 ENCOUNTER — Encounter: Payer: Self-pay | Admitting: Emergency Medicine

## 2021-02-22 DIAGNOSIS — M25512 Pain in left shoulder: Secondary | ICD-10-CM | POA: Diagnosis not present

## 2021-02-22 DIAGNOSIS — Z7982 Long term (current) use of aspirin: Secondary | ICD-10-CM | POA: Diagnosis not present

## 2021-02-22 DIAGNOSIS — R002 Palpitations: Secondary | ICD-10-CM | POA: Insufficient documentation

## 2021-02-22 DIAGNOSIS — R42 Dizziness and giddiness: Secondary | ICD-10-CM | POA: Insufficient documentation

## 2021-02-22 DIAGNOSIS — R197 Diarrhea, unspecified: Secondary | ICD-10-CM | POA: Insufficient documentation

## 2021-02-22 DIAGNOSIS — R5383 Other fatigue: Secondary | ICD-10-CM | POA: Insufficient documentation

## 2021-02-22 DIAGNOSIS — I10 Essential (primary) hypertension: Secondary | ICD-10-CM

## 2021-02-22 DIAGNOSIS — R000 Tachycardia, unspecified: Secondary | ICD-10-CM | POA: Insufficient documentation

## 2021-02-22 DIAGNOSIS — Z79899 Other long term (current) drug therapy: Secondary | ICD-10-CM | POA: Insufficient documentation

## 2021-02-22 DIAGNOSIS — R110 Nausea: Secondary | ICD-10-CM | POA: Diagnosis not present

## 2021-02-22 DIAGNOSIS — M542 Cervicalgia: Secondary | ICD-10-CM | POA: Diagnosis not present

## 2021-02-22 DIAGNOSIS — D72829 Elevated white blood cell count, unspecified: Secondary | ICD-10-CM | POA: Insufficient documentation

## 2021-02-22 LAB — CBC
HCT: 44.1 % (ref 36.0–46.0)
Hemoglobin: 15.5 g/dL — ABNORMAL HIGH (ref 12.0–15.0)
MCH: 31.2 pg (ref 26.0–34.0)
MCHC: 35.1 g/dL (ref 30.0–36.0)
MCV: 88.7 fL (ref 80.0–100.0)
Platelets: 247 10*3/uL (ref 150–400)
RBC: 4.97 MIL/uL (ref 3.87–5.11)
RDW: 12.6 % (ref 11.5–15.5)
WBC: 13.5 10*3/uL — ABNORMAL HIGH (ref 4.0–10.5)
nRBC: 0 % (ref 0.0–0.2)

## 2021-02-22 LAB — BASIC METABOLIC PANEL
Anion gap: 5 (ref 5–15)
BUN: 10 mg/dL (ref 6–20)
CO2: 24 mmol/L (ref 22–32)
Calcium: 9.3 mg/dL (ref 8.9–10.3)
Chloride: 108 mmol/L (ref 98–111)
Creatinine, Ser: 0.56 mg/dL (ref 0.44–1.00)
GFR, Estimated: >60 mL/min (ref >60)
Glucose, Bld: 102 mg/dL — ABNORMAL HIGH (ref 70–99)
Potassium: 3.7 mmol/L (ref 3.5–5.1)
Sodium: 137 mmol/L (ref 135–145)

## 2021-02-22 LAB — TROPONIN I (HIGH SENSITIVITY): Troponin I (High Sensitivity): 3 ng/L (ref <18)

## 2021-02-22 MED ORDER — ACETAMINOPHEN 500 MG PO TABS
1000.0000 mg | ORAL_TABLET | Freq: Once | ORAL | Status: AC
  Administered 2021-02-22: 1000 mg via ORAL
  Filled 2021-02-22: qty 2

## 2021-02-22 MED ORDER — IBUPROFEN 400 MG PO TABS
400.0000 mg | ORAL_TABLET | Freq: Once | ORAL | Status: AC
  Administered 2021-02-22: 400 mg via ORAL
  Filled 2021-02-22: qty 1

## 2021-02-22 NOTE — ED Notes (Signed)
Last night has episodes of increased heart rate.  Felt like it does when she  Is in afib.  Says bp was up as well.   Takes metoprolol.  Also says has pain in neck, left shoulder that does not change with movement.  She is in nad now.  She does not feel any heart racing now. Pain is 3/10 in shoulder/neck.

## 2021-02-22 NOTE — ED Triage Notes (Signed)
Pt to ED via POV, pt states that she called EMS last night because her heart rate was elevated and she could not get it come down. Pt states that today she is having nausea, neck pain, shoulder pain, and fatigue. Pt is in NAD currently.

## 2021-02-22 NOTE — ED Provider Notes (Signed)
As for  [facility] Emergency Department Provider Note  ____________________________________________   Event Date/Time   First MD Initiated Contact with Patient 02/22/21 1507     (approximate)  I have reviewed the triage vital signs and the nursing notes.   HISTORY  Chief Complaint Shoulder Pain, Fatigue, and Neck Pain   HPI Kelly [name] is a 40 y.o. female with a past medical history of anxiety, depression and paroxysmal A. fib on metoprolol but not anticoagulated who presents for an episode of palpitations, dizziness, soreness in her left shoulder and left neck as well as some nausea and diarrhea that she states began last night but has subsequently subsided.  She notes EMS was called and they did an EKG and told her she may have SVT but she took some metoprolol and her heart rate seemed to improve.  She said her heart rate was elevated in the 170s to 180s which was very abnormally high for her.  She states she is now feeling much better but not completely back to normal.  She states this is very similar to episodes of been ongoing on and off over the last several months and she is currently following with a cardiologist but they are not sure what is causing this.  She states she has been compliant with her metoprolol.  She denies any other acute symptoms including fevers, cough, vision changes, vertigo, headache, syncope, back pain, abdominal pain, urinary symptoms, rash or extremity pain.  No focal weakness numbness or tingling.  Denies any illicit drug use or significant EtOH use.  No other acute concerns at this time.     Past Medical History:  Diagnosis Date   Anxiety    Depression    PONV (postoperative nausea and vomiting)     Patient Active Problem List   Diagnosis Date Noted   AF (paroxysmal atrial fibrillation) (HCC)    Dehydration    Tachycardia 08/26/2020   Hypokalemia 08/26/2020   Cholecystitis, acute 01/11/2015   Cholelithiases      Past Surgical History:  Procedure Laterality Date   CESAREAN SECTION N/A    X2   CHOLECYSTECTOMY N/A 01/11/2015   Procedure: LAPAROSCOPIC CHOLECYSTECTOMY WITH INTRAOPERATIVE CHOLANGIOGRAM;  Surgeon: Tiney Rouge III, MD;  Location: [facility] ORS;  Service: General;  Laterality: N/A;    Prior to Admission medications   Medication Sig Start Date End Date Taking? Authorizing Provider  aspirin EC 81 MG EC tablet Take 1 tablet (81 mg total) by mouth daily. Swallow whole. 08/27/20  Yes Wieting, Richard, MD  loratadine (CLARITIN) 10 MG tablet Take 10 mg by mouth at bedtime.   Yes [provider]  metoprolol tartrate (LOPRESSOR) 25 MG tablet Take 1 tablet (25 mg total) by mouth 2 (two) times daily. 08/27/20  Yes Wieting, Richard, MD  potassium chloride SA (KLOR-CON) 20 MEQ tablet Take 1 tablet (20 mEq total) by mouth daily. 08/28/20   Alford Highland, MD    Allergies Patient has no known allergies.  Family History  Problem Relation Age of Onset   Diabetes Mother    Hypertension Mother    Hyperlipidemia Mother    Heart disease Father     Social History Social History   Tobacco Use   Smoking status: Never   Smokeless tobacco: Never  Substance Use Topics   Alcohol use: No   Drug use: No    Review of Systems  Review of Systems  Constitutional:  Negative for chills and fever.  HENT:  Negative  for sore throat.   Eyes:  Negative for pain.  Respiratory:  Negative for cough and stridor.   Cardiovascular:  Positive for palpitations. Negative for chest pain.  Gastrointestinal:  Positive for nausea. Negative for vomiting.  Musculoskeletal:  Positive for myalgias (L shoulder) and neck pain.  Skin:  Negative for rash.  Neurological:  Positive for dizziness. Negative for seizures, loss of consciousness and headaches.  Psychiatric/Behavioral:  Negative for suicidal ideas.   All other systems reviewed and are negative.    ____________________________________________   PHYSICAL  EXAM:  VITAL SIGNS: ED Triage Vitals  Enc Vitals Group     BP 02/22/21 1340 140/80     Pulse Rate 02/22/21 1340 80     Resp 02/22/21 1340 16     Temp 02/22/21 1342 98.7 F (37.1 C)     Temp Source 02/22/21 1342 Oral     SpO2 02/22/21 1340 100 %     Weight 02/22/21 1343 240 lb (108.9 kg)     Height 02/22/21 1343 5\' 10"  (1.778 m)     Head Circumference --      Peak Flow --      Pain Score 02/22/21 1342 3     Pain Loc --      Pain Edu? --      Excl. in GC? --    Vitals:   02/22/21 1340 02/22/21 1342  BP: 140/80   Pulse: 80   Resp: 16   Temp:  98.7 F (37.1 C)  SpO2: 100%    Physical Exam Vitals and nursing note reviewed.  Constitutional:      General: She is not in acute distress.    Appearance: She is well-developed.  HENT:     Head: Normocephalic and atraumatic.     Right Ear: External ear normal.     Left Ear: External ear normal.     Nose: Nose normal.  Eyes:     Conjunctiva/sclera: Conjunctivae normal.  Cardiovascular:     Rate and Rhythm: Normal rate and regular rhythm.     Pulses: Normal pulses.     Heart sounds: No murmur heard. Pulmonary:     Effort: Pulmonary effort is normal. No respiratory distress.     Breath sounds: Normal breath sounds.  Abdominal:     Palpations: Abdomen is soft.     Tenderness: There is no abdominal tenderness. There is no right CVA tenderness or left CVA tenderness.  Musculoskeletal:     Cervical back: Neck supple.     Right lower leg: No edema.     Left lower leg: No edema.  Skin:    General: Skin is warm and dry.     Capillary Refill: Capillary refill takes less than 2 seconds.  Neurological:     General: No focal deficit present.     Mental Status: She is alert and oriented to person, place, and time.  Psychiatric:        Mood and Affect: Mood normal.    2+ radial pulses.  Left shoulder is unremarkable.  Patient has full range of motion.  No overlying skin changes or point tenderness effusion or deformity.  C-spine is  unremarkable. ____________________________________________   LABS (all labs ordered are listed, but only abnormal results are displayed)  Labs Reviewed  BASIC METABOLIC PANEL - Abnormal; Notable for the following components:      Result Value   Glucose, Bld 102 (*)    All other components within normal limits  CBC - Abnormal;  Notable for the following components:   WBC 13.5 (*)    Hemoglobin 15.5 (*)    All other components within normal limits  POC URINE PREG, ED  TROPONIN I (HIGH SENSITIVITY)  TROPONIN I (HIGH SENSITIVITY)   ____________________________________________  EKG  Sinus rhythm with a ventricular rate of 83, normal axis, Q waves in V2 and some artifact in lead II without other clear evidence of acute ischemia or significant arrhythmia. ____________________________________________  RADIOLOGY  ED MD interpretation: Chest x-ray shows no evidence of focal consolidation, effusion, edema, pneumothorax or any other clear acute intrathoracic process.  Official radiology report(s): DG Chest 2 View  Result Date: 02/22/2021 CLINICAL DATA:  Neck, shoulder pain.  Tachycardia EXAM: CHEST - 2 VIEW COMPARISON:  08/26/2020 FINDINGS: The heart size and mediastinal contours are within normal limits. Both lungs are clear. The visualized skeletal structures are unremarkable. IMPRESSION: Normal study. Electronically Signed   By: Charlett NoseKevin  Dover M.D.   On: 02/22/2021 14:09    ____________________________________________   PROCEDURES  Procedure(s) performed (including Critical Care):  .1-3 Lead EKG Interpretation  Date/Time: 02/22/2021 3:36 PM Performed by: Gilles ChiquitoSmith, Selah Klang P, MD Authorized by: Gilles ChiquitoSmith, Sugey Trevathan P, MD     Interpretation: normal     ECG rate assessment: normal     Rhythm: sinus rhythm     Ectopy: none     Conduction: normal     ____________________________________________   INITIAL IMPRESSION / ASSESSMENT AND PLAN / ED COURSE      Patient presents with  above-stated history exam for assessment of an episode of palpitations, dizziness, some shortness of breath, nausea and soreness in her left arm and neck that she states began last night and is similar to multiple prior episodes on and off for the last several months and symptoms have vastly improved from once again.  On arrival she is afebrile hemodynamically stable.  She neurovascular intact throughout the left upper extremity without any overlying skin changes over the shoulder chest or back.  Suspect possible paroxysmal tachyarrhythmia.  Patient's heart rate in the emergency room is appropriate in the 170s to 180s on the monitor.  ECG has some nonspecific findings but no evidence of arrhythmia, A. fib and given nonelevated troponin obtained greater than 3 hours after symptom onset I have low suspicion for ACS or myocarditis.  Chest x-ray has no evidence of effusion, edema, pneumothorax or any other clear acute intrathoracic process.  BMP has no significant electrolyte or metabolic derangements.  BC remarkable for mild leukocytosis somewhat nonspecific as it seems patient had some mild leukocytosis when she was last evaluated about 6 months ago for the symptoms in the emergency room.  She has no evidence of acute anemia that explain this episode.  On review of records it seems that when she was evaluated in February for a very similar episode that she states at that time was much worse she had a CT chest and a D-dimer obtained and a D-dimer was less than 0.5 and CT chest was unremarkable.  Given she states today's episode is very similar to that and has largely resolved and she has no evidence of tachycardia, hypoxia, tachypnea or clear risk factors for PE I have a low suspicion for that at this time.  Given her heart rate is now normal she states it was in the 120s earlier and she is feeling much better think she is stable for discharge with close outpatient PCP and cardiology follow-up.  She is amenable to  trying some Tylenol  ibuprofen she has not had any analgesia prior to arrival.  Discharged stable condition.  Strict return precautions advised and discussed.       ____________________________________________   FINAL CLINICAL IMPRESSION(S) / ED DIAGNOSES  Final diagnoses:  Palpitations  Acute pain of left shoulder  Nausea  Tachycardia  Hypertension, unspecified type    Medications  acetaminophen (TYLENOL) tablet 1,000 mg (1,000 mg Oral Given 02/22/21 1523)  ibuprofen (ADVIL) tablet 400 mg (400 mg Oral Given 02/22/21 1524)     ED Discharge Orders     None        Note:  This document was prepared using Dragon voice recognition software and may include unintentional dictation errors.    Gilles Chiquito, MD 02/22/21 [phone]

## 2021-02-25 ENCOUNTER — Other Ambulatory Visit: Payer: Self-pay | Admitting: Obstetrics and Gynecology

## 2021-02-25 DIAGNOSIS — Z1231 Encounter for screening mammogram for malignant neoplasm of breast: Secondary | ICD-10-CM

## 2021-03-12 ENCOUNTER — Ambulatory Visit
Admission: RE | Admit: 2021-03-12 | Discharge: 2021-03-12 | Disposition: A | Discharge status: Home / Self Care | Payer: No Typology Code available for payment source | Source: Ambulatory Visit | Attending: Obstetrics and Gynecology | Admitting: Obstetrics and Gynecology

## 2021-03-12 ENCOUNTER — Other Ambulatory Visit: Payer: Self-pay

## 2021-03-12 DIAGNOSIS — Z1231 Encounter for screening mammogram for malignant neoplasm of breast: Secondary | ICD-10-CM | POA: Diagnosis present

## 2021-05-13 DIAGNOSIS — I10 Essential (primary) hypertension: Secondary | ICD-10-CM | POA: Insufficient documentation

## 2021-05-18 ENCOUNTER — Emergency Department: Payer: No Typology Code available for payment source

## 2021-05-18 ENCOUNTER — Other Ambulatory Visit: Payer: Self-pay

## 2021-05-18 ENCOUNTER — Encounter: Payer: Self-pay | Admitting: Emergency Medicine

## 2021-05-18 ENCOUNTER — Emergency Department
Admission: EM | Admit: 2021-05-18 | Discharge: 2021-05-18 | Disposition: A | Discharge status: Home / Self Care | Payer: No Typology Code available for payment source | Attending: Emergency Medicine | Admitting: Emergency Medicine

## 2021-05-18 DIAGNOSIS — R002 Palpitations: Secondary | ICD-10-CM | POA: Diagnosis not present

## 2021-05-18 DIAGNOSIS — R531 Weakness: Secondary | ICD-10-CM | POA: Diagnosis not present

## 2021-05-18 DIAGNOSIS — R42 Dizziness and giddiness: Secondary | ICD-10-CM | POA: Insufficient documentation

## 2021-05-18 DIAGNOSIS — Z7982 Long term (current) use of aspirin: Secondary | ICD-10-CM | POA: Diagnosis not present

## 2021-05-18 DIAGNOSIS — R0789 Other chest pain: Secondary | ICD-10-CM | POA: Insufficient documentation

## 2021-05-18 DIAGNOSIS — I10 Essential (primary) hypertension: Secondary | ICD-10-CM | POA: Diagnosis not present

## 2021-05-18 HISTORY — DX: Unspecified atrial fibrillation: I48.91

## 2021-05-18 HISTORY — DX: Essential (primary) hypertension: I10

## 2021-05-18 LAB — CBC
HCT: 45.7 % (ref 36.0–46.0)
Hemoglobin: 16.5 g/dL — ABNORMAL HIGH (ref 12.0–15.0)
MCH: 31.7 pg (ref 26.0–34.0)
MCHC: 36.1 g/dL — ABNORMAL HIGH (ref 30.0–36.0)
MCV: 87.7 fL (ref 80.0–100.0)
Platelets: 261 10*3/uL (ref 150–400)
RBC: 5.21 MIL/uL — ABNORMAL HIGH (ref 3.87–5.11)
RDW: 12.1 % (ref 11.5–15.5)
WBC: 8 10*3/uL (ref 4.0–10.5)
nRBC: 0 % (ref 0.0–0.2)

## 2021-05-18 LAB — BASIC METABOLIC PANEL
Anion gap: 9 (ref 5–15)
BUN: 9 mg/dL (ref 6–20)
CO2: 22 mmol/L (ref 22–32)
Calcium: 9.6 mg/dL (ref 8.9–10.3)
Chloride: 108 mmol/L (ref 98–111)
Creatinine, Ser: 0.7 mg/dL (ref 0.44–1.00)
GFR, Estimated: >60 mL/min (ref >60)
Glucose, Bld: 160 mg/dL — ABNORMAL HIGH (ref 70–99)
Potassium: 3.6 mmol/L (ref 3.5–5.1)
Sodium: 139 mmol/L (ref 135–145)

## 2021-05-18 LAB — POC URINE PREG, ED: Preg Test, Ur: NEGATIVE

## 2021-05-18 LAB — TROPONIN I (HIGH SENSITIVITY): Troponin I (High Sensitivity): 3 ng/L (ref <18)

## 2021-05-18 NOTE — ED Triage Notes (Signed)
Pt reports was on metoprolol and switch Tuesday to diltiazem.

## 2021-05-18 NOTE — ED Triage Notes (Signed)
Pt reports chest pressure with a dull ache, SOB and states the pain is also in her left shoulder and neck. Pt also reports feels like her heart is beating irregular  Pt reports has had intermittent issues like this for 8 days.

## 2021-05-18 NOTE — ED Notes (Signed)
Error on VS with 88% o2 sat. Pt O2 sat corrected to 99% on RA.

## 2021-05-18 NOTE — ED Provider Notes (Signed)
[facility] Emergency Department Provider Note   ____________________________________________    I have reviewed the triage vital signs and the nursing notes.   HISTORY  Chief Complaint Chest Pain, Shoulder Pain, and Irregular Heart Beat     HPI Kelly [name] is a 40 y.o. female with a history of paroxysmal atrial fibrillation and additional history as below who presents after an episode of palpitations and diffuse weakness.  Patient reports since June she has been having these events more frequently.  She does see Dr. Darrold Junker of cardiology, just completed 30-day Holter monitoring, reviewed records, no arrhythmia noted.  Recently switched to Cardizem for metoprolol on November 8.  Reports that when she has these events where she can feel her heart rate getting higher she feels diffusely weak and lightheaded and occasionally has chest discomfort.  Currently is feeling better  Past Medical History:  Diagnosis Date   Anxiety    Atrial fibrillation (HCC)    Depression    Hypertension    PONV (postoperative nausea and vomiting)     Patient Active Problem List   Diagnosis Date Noted   AF (paroxysmal atrial fibrillation) (HCC)    Dehydration    Tachycardia 08/26/2020   Hypokalemia 08/26/2020   Cholecystitis, acute 01/11/2015   Cholelithiases     Past Surgical History:  Procedure Laterality Date   CESAREAN SECTION N/A    X2   CHOLECYSTECTOMY N/A 01/11/2015   Procedure: LAPAROSCOPIC CHOLECYSTECTOMY WITH INTRAOPERATIVE CHOLANGIOGRAM;  Surgeon: Tiney Rouge III, MD;  Location: [facility] ORS;  Service: General;  Laterality: N/A;    Prior to Admission medications   Medication Sig Start Date End Date Taking? Authorizing Provider  aspirin EC 81 MG EC tablet Take 1 tablet (81 mg total) by mouth daily. Swallow whole. 08/27/20   Alford Highland, MD  loratadine (CLARITIN) 10 MG tablet Take 10 mg by mouth at bedtime.    [provider]  metoprolol  tartrate (LOPRESSOR) 25 MG tablet Take 1 tablet (25 mg total) by mouth 2 (two) times daily. 08/27/20   Alford Highland, MD  potassium chloride SA (KLOR-CON) 20 MEQ tablet Take 1 tablet (20 mEq total) by mouth daily. 08/28/20   Alford Highland, MD     Allergies Patient has no known allergies.  Family History  Problem Relation Age of Onset   Diabetes Mother    Hypertension Mother    Hyperlipidemia Mother    Heart disease Father    Breast cancer Neg Hx     Social History Social History   Tobacco Use   Smoking status: Never   Smokeless tobacco: Never  Substance Use Topics   Alcohol use: No   Drug use: No    Review of Systems  Constitutional: No fever/chills Eyes: No visual changes.  ENT: No sore throat. Cardiovascular: As above Respiratory: Denies shortness of breath. Gastrointestinal: No abdominal pain.  No nausea, no vomiting.   Genitourinary: Negative for dysuria. Musculoskeletal: Negative for back pain. Skin: Negative for rash. Neurological: Negative for headaches or weakness   ____________________________________________   PHYSICAL EXAM:  VITAL SIGNS: ED Triage Vitals  Enc Vitals Group     BP 05/18/21 1232 (!) 154/110     Pulse Rate 05/18/21 1232 (!) 117     Resp 05/18/21 1232 20     Temp 05/18/21 1232 97.8 F (36.6 C)     Temp Source 05/18/21 1232 Oral     SpO2 05/18/21 1232 100 %     Weight 05/18/21  1229 108.9 kg (240 lb)     Height 05/18/21 1229 1.778 m (5\' 10" )     Head Circumference --      Peak Flow --      Pain Score 05/18/21 1229 5     Pain Loc --      Pain Edu? --      Excl. in GC? --     Constitutional: Alert and oriented. No acute distress. Pleasant and interactive Eyes: Conjunctivae are normal.  Head: Atraumatic. Nose: No congestion/rhinnorhea. Mouth/Throat: Mucous membranes are moist.   Neck:  Painless ROM Cardiovascular: Normal rate, regular rhythm. Grossly normal heart sounds.  Good peripheral circulation. Respiratory: Normal  respiratory effort.  No retractions. Lungs CTAB. Gastrointestinal: Soft and nontender. No distention.    Musculoskeletal: No lower extremity tenderness nor edema.  Warm and well perfused Neurologic:  Normal speech and language. No gross focal neurologic deficits are appreciated.  Skin:  Skin is warm, dry and intact. No rash noted. Psychiatric: Mood and affect are normal. Speech and behavior are normal.  ____________________________________________   LABS (all labs ordered are listed, but only abnormal results are displayed)  Labs Reviewed  BASIC METABOLIC PANEL - Abnormal; Notable for the following components:      Result Value   Glucose, Bld 160 (*)    All other components within normal limits  CBC - Abnormal; Notable for the following components:   RBC 5.21 (*)    Hemoglobin 16.5 (*)    MCHC 36.1 (*)    All other components within normal limits  POC URINE PREG, ED  TROPONIN I (HIGH SENSITIVITY)  TROPONIN I (HIGH SENSITIVITY)   ____________________________________________  EKG  ED ECG REPORT I, 05/20/21, the attending physician, personally viewed and interpreted this ECG.  Date: 05/18/2021  Rhythm: Sinus tachycardia QRS Axis: normal Intervals: normal ST/T Wave abnormalities: normal Narrative Interpretation: no evidence of acute ischemia  ____________________________________________  RADIOLOGY  Chest x-ray viewed by me, no acute abnormality ____________________________________________   PROCEDURES  Procedure(s) performed: No  Procedures   Critical Care performed: No ____________________________________________   INITIAL IMPRESSION / ASSESSMENT AND PLAN / ED COURSE  Pertinent labs & imaging results that were available during my care of the patient were reviewed by me and considered in my medical decision making (see chart for details).   Reviewed past medical history including admission in February of this year.  Patient had normal echocardiogram,  normal CT at that time.  EKG today is reassuring.  TSH was normal in the past as well  Patient reports she is going to see Dr. March of EP at the end of this month  She is feeling improved currently.  Lab work is overall quite reassuring, normal high sensitive troponin.  EKG initially demonstrated sinus tachycardia, now her heart rate has normalized  We discussed several options but agreed to continue the 180 mg of Cardizem at this time, close follow-up with Dr. Lalla Brothers and cardiology    ____________________________________________   FINAL CLINICAL IMPRESSION(S) / ED DIAGNOSES  Final diagnoses:  Palpitations        Note:  This document was prepared using Dragon voice recognition software and may include unintentional dictation errors.    Lalla Brothers, MD 05/18/21 1459

## 2021-06-04 ENCOUNTER — Encounter: Payer: Self-pay | Admitting: Cardiology

## 2021-06-04 ENCOUNTER — Ambulatory Visit: Payer: No Typology Code available for payment source | Admitting: Cardiology

## 2021-06-04 ENCOUNTER — Other Ambulatory Visit: Payer: Self-pay

## 2021-06-04 VITALS — BP 140/98 | HR 88 | Ht 70.0 in | Wt 244.0 lb

## 2021-06-04 DIAGNOSIS — R000 Tachycardia, unspecified: Secondary | ICD-10-CM | POA: Diagnosis not present

## 2021-06-04 DIAGNOSIS — I480 Paroxysmal atrial fibrillation: Secondary | ICD-10-CM

## 2021-06-04 NOTE — Patient Instructions (Addendum)
Medication Instructions:  Your physician recommends that you continue on your current medications as directed. Please refer to the Current Medication list given to you today. *If you need a refill on your cardiac medications before your next appointment, please call your pharmacy*  Lab Work: None ordered. If you have labs (blood work) drawn today and your tests are completely normal, you will receive your results only by: MyChart Message (if you have MyChart) OR A paper copy in the mail If you have any lab test that is abnormal or we need to change your treatment, we will call you to review the results.  Testing/Procedures: None ordered.  Follow-Up: At [facility], you and your health needs are our priority.  As part of our continuing mission to provide you with exceptional heart care, we have created designated Provider Care Teams.  These Care Teams include your primary Cardiologist (physician) and Advanced Practice Providers (APPs -  Physician Assistants and Nurse Practitioners) who all work together to provide you with the care you need, when you need it.  Your next appointment:   Your physician wants you to follow-up in: one year with  Kelly Ducking, NP Eula Listen, PA-C Marisue Ivan, PA-C Cadence Fransico Michael, [place] You will receive a reminder letter in the mail two months in advance. If you don't receive a letter, please call our office to schedule the follow-up appointment.

## 2021-06-04 NOTE — Progress Notes (Signed)
Electrophysiology Office Note:    Date:  06/04/2021   ID:  Kelly [name], DOB 06/16/1981, MRN [id number]  PCP:  Marisue Ivan, MD  [facility] Cardiologist:  None  [facility] Electrophysiologist:  Lanier Prude, MD   Referring MD: Marcina Millard, MD   Chief Complaint: Paroxysmal atrial fibrillation  History of Present Illness:    Kelly [name] is a 40 y.o. female who presents for an evaluation of paroxysmal atrial fibrillation at the request of Dr. Darrold Junker. Their medical history includes PCOS, hypertension.  Her atrial fibrillation diagnosis dates back to February 2022 when she presented to the emergency department with palpitations and was found to be in A. fib with rapid ventricular rates.  She spontaneously converted to sinus rhythm.  She has normal left ventricular function.  She had other emergency department visits in August and November 2022 with similar presentations.  Since the first episode in February he has not had a recurrence of atrial fibrillation symptoms.  She has had episodes of elevated heart rates in the 110s to 120s.  These episodes were accompanied by elevated blood pressures.  There are also shortness of breath symptoms and sometimes nausea that accompany these episodes.  All of her episodes seem to be around the time of ovulation during her monthly cycle.     Past Medical History:  Diagnosis Date   Anxiety    Atrial fibrillation (HCC)    Depression    Hypertension    PONV (postoperative nausea and vomiting)     Past Surgical History:  Procedure Laterality Date   CESAREAN SECTION N/A    X2   CHOLECYSTECTOMY N/A 01/11/2015   Procedure: LAPAROSCOPIC CHOLECYSTECTOMY WITH INTRAOPERATIVE CHOLANGIOGRAM;  Surgeon: Tiney Rouge III, MD;  Location: [facility] ORS;  Service: General;  Laterality: N/A;    Current Medications: Current Meds  Medication Sig   aspirin EC 81 MG EC tablet Take 1 tablet (81 mg total) by mouth daily. Swallow  whole.   diltiazem (CARDIZEM CD) 180 MG 24 hr capsule Take 180 mg by mouth daily.   loratadine (CLARITIN) 10 MG tablet Take 10 mg by mouth at bedtime.     Allergies:   Patient has no known allergies.   Social History   Socioeconomic History   Marital status: Married    Spouse name: Not on file   Number of children: Not on file   Years of education: Not on file   Highest education level: Not on file  Occupational History   Not on file  Tobacco Use   Smoking status: Never   Smokeless tobacco: Never  Substance and Sexual Activity   Alcohol use: No   Drug use: No   Sexual activity: Yes  Other Topics Concern   Not on file  Social History Narrative   Not on file   Social Determinants of Health   Financial Resource Strain: Not on file  Food Insecurity: Not on file  Transportation Needs: Not on file  Physical Activity: Not on file  Stress: Not on file  Social Connections: Not on file     Family History: The patient's family history includes Diabetes in her mother; Heart disease in her father; Hyperlipidemia in her mother; Hypertension in her mother. There is no history of Breast cancer.  ROS:   Please see the history of present illness.    All other systems reviewed and are negative.  EKGs/Labs/Other Studies Reviewed:    The following studies were reviewed today:  August 26, 2020  echo Left ventricular function normal, 55% Right ventricular function normal No significant valvular abnormalities  August 26, 2020 EKG shows atrial fibrillation with a ventricular rate of 170 bpm  EKG:  The ekg ordered today demonstrates sinus rhythm.  Normal intervals.   Recent Labs: 08/26/2020: B Natriuretic Peptide 92.7; Magnesium 2.0; TSH 2.102 05/18/2021: BUN 9; Creatinine, Ser 0.70; Hemoglobin 16.5; Platelets 261; Potassium 3.6; Sodium 139  Recent Lipid Panel No results found for: CHOL, TRIG, HDL, CHOLHDL, VLDL, LDLCALC, LDLDIRECT  Physical Exam:    VS:  BP (!) 140/98 (BP  Location: Left Arm, Patient Position: Sitting, Cuff Size: Normal)   Pulse 88   Ht 5\' 10"  (1.778 m)   Wt 244 lb (110.7 kg)   LMP 05/10/2021 (Approximate)   SpO2 96%   BMI 35.01 kg/m     Wt Readings from Last 3 Encounters:  06/04/21 244 lb (110.7 kg)  05/18/21 240 lb (108.9 kg)  02/22/21 240 lb (108.9 kg)     GEN:  Well nourished, well developed in no acute distress HEENT: Normal NECK: No JVD; No carotid bruits LYMPHATICS: No lymphadenopathy CARDIAC: RRR, no murmurs, rubs, gallops RESPIRATORY:  Clear to auscultation without rales, wheezing or rhonchi  ABDOMEN: Soft, non-tender, non-distended MUSCULOSKELETAL:  No edema; No deformity  SKIN: Warm and dry NEUROLOGIC:  Alert and oriented x 3 PSYCHIATRIC:  Normal affect       ASSESSMENT:    1. AF (paroxysmal atrial fibrillation) (HCC)    PLAN:    In order of problems listed above:  #Paroxysmal atrial fibrillation 1 episode in February.  Her most recent episodes of tachycardia are sinus tachycardia and not atrial fibrillation.  She is very clear in her history that these are different episodes.  I suspect she really has only experienced 1 episode of atrial fibrillation.  She will continue to monitor the burden of her A. fib and let March know if it increases.  I told her that if her A. fib burden increased we would want to intervene early to prevent progression.  #Tachycardia I suspect these are sinus tachycardia versus atrial tachycardia episodes.  I do not suspect an SVT.  I do not suspect atrial fibrillation or flutter.  I think Cardizem would be a reasonable choice moving forward.  I would continue the current dose of 100 mg by mouth once daily.  Given the link to her ovulation, recommend discussions with her OB/GYN.  We did discuss loop recorder monitoring during today's visit.  I do not think we are at a point where loop recorder monitoring would contribute much to her diagnostics.  Follow-up 1 year with  APP.       Medication Adjustments/Labs and Tests Ordered: Current medicines are reviewed at length with the patient today.  Concerns regarding medicines are outlined above.  No orders of the defined types were placed in this encounter.  No orders of the defined types were placed in this encounter.    Signed, [place]. Rossie Muskrat, MD, [facility], [facility] 06/04/2021 10:07 AM    Electrophysiology [facility]

## 2021-06-06 ENCOUNTER — Encounter: Payer: Self-pay | Admitting: Cardiology

## 2021-07-11 ENCOUNTER — Telehealth: Payer: Self-pay | Admitting: Cardiology

## 2021-07-11 DIAGNOSIS — R000 Tachycardia, unspecified: Secondary | ICD-10-CM

## 2021-07-11 DIAGNOSIS — I480 Paroxysmal atrial fibrillation: Secondary | ICD-10-CM

## 2021-07-11 NOTE — Telephone Encounter (Signed)
STAT if patient feels like he/she is going to faint   Are you dizzy now? No started yesterday afternoon   Do you feel faint or have you passed out? Feels exhausted right now denies any LOC   Do you have any other symptoms? Shoulder pain nausea fatigue   Have you checked your HR and BP (record if available)? 139/80's and HR 70-80's

## 2021-07-11 NOTE — Telephone Encounter (Signed)
The patient is complaining about lightheadedness, left shoulder and back pain along with nausea around 1:45 pm on 07/10/21. She was in the car line at school waiting to pick her kids up. Symptoms lasted about 5 minutes. When she got home BP 121/81 HR 85 cuff indicated irregular heart rate.   Around 6:30 pm this occurred again while at the hair salon, when she arrived home BP 142/78 HR 86  cuff indicated irregular heart rate. Experienced again same symptoms as earlier in the day with added feeling of needing to have a BM. Symptoms lasted about 10 minutes.  BP before bed 150/91 HR 72, this was after she took an hour nap because she was feeling weak and tired. Her husband woke her up to go to bed.   BP this morning 119/73, current 139/80 74. Today she is tired, weak, and has some nausea but still able to eat and drink.    No missed doses of Diltiazem.   Patient would like to note that she went to see OB/GYN after last office visit as recommended. She started her on Seasonale Cleopatra [place]) Contraceptive.   Gave ED precautions. Patient verbalized understanding. Will forward to MD for advisement.

## 2021-07-15 ENCOUNTER — Ambulatory Visit (INDEPENDENT_AMBULATORY_CARE_PROVIDER_SITE_OTHER): Payer: No Typology Code available for payment source

## 2021-07-15 DIAGNOSIS — I480 Paroxysmal atrial fibrillation: Secondary | ICD-10-CM

## 2021-07-15 DIAGNOSIS — R000 Tachycardia, unspecified: Secondary | ICD-10-CM

## 2021-07-15 NOTE — Progress Notes (Unsigned)
Enrolled for [employer] to mail a ZIO XT long term holter monitor to the patients address on file.  

## 2021-07-15 NOTE — Telephone Encounter (Signed)
Dr. Quentin Ore recommended wearing a 2 week zio monitor.  Patient verbalized understanding and agreement.

## 2021-07-18 DIAGNOSIS — R000 Tachycardia, unspecified: Secondary | ICD-10-CM | POA: Diagnosis not present

## 2021-07-18 DIAGNOSIS — I480 Paroxysmal atrial fibrillation: Secondary | ICD-10-CM

## 2021-08-09 ENCOUNTER — Encounter: Payer: Self-pay | Admitting: Cardiology

## 2021-08-11 NOTE — Telephone Encounter (Signed)
See result note.  

## 2021-09-03 ENCOUNTER — Encounter: Payer: Self-pay | Admitting: Cardiology

## 2021-09-10 ENCOUNTER — Other Ambulatory Visit: Payer: Self-pay

## 2021-09-10 ENCOUNTER — Emergency Department: Payer: No Typology Code available for payment source

## 2021-09-10 ENCOUNTER — Ambulatory Visit
Admission: RE | Admit: 2021-09-10 | Discharge: 2021-09-10 | Disposition: A | Discharge status: Home / Self Care | Payer: No Typology Code available for payment source | Source: Ambulatory Visit | Attending: Medical | Admitting: Medical

## 2021-09-10 ENCOUNTER — Ambulatory Visit: Payer: No Typology Code available for payment source | Admitting: Medical

## 2021-09-10 ENCOUNTER — Telehealth: Payer: Self-pay

## 2021-09-10 ENCOUNTER — Ambulatory Visit
Admission: RE | Admit: 2021-09-10 | Discharge: 2021-09-10 | Disposition: A | Discharge status: Home / Self Care | Payer: No Typology Code available for payment source | Source: Ambulatory Visit | Attending: *Deleted | Admitting: *Deleted

## 2021-09-10 ENCOUNTER — Encounter: Payer: Self-pay | Admitting: Emergency Medicine

## 2021-09-10 ENCOUNTER — Emergency Department
Admission: EM | Admit: 2021-09-10 | Discharge: 2021-09-10 | Disposition: A | Discharge status: Home / Self Care | Payer: No Typology Code available for payment source | Attending: Emergency Medicine | Admitting: Emergency Medicine

## 2021-09-10 ENCOUNTER — Encounter: Payer: Self-pay | Admitting: Medical

## 2021-09-10 ENCOUNTER — Other Ambulatory Visit
Admission: RE | Admit: 2021-09-10 | Discharge: 2021-09-10 | Disposition: A | Discharge status: Home / Self Care | Payer: No Typology Code available for payment source | Source: Ambulatory Visit | Attending: *Deleted | Admitting: *Deleted

## 2021-09-10 VITALS — BP 140/84 | HR 116 | Ht 70.0 in | Wt 224.5 lb

## 2021-09-10 DIAGNOSIS — I10 Essential (primary) hypertension: Secondary | ICD-10-CM | POA: Insufficient documentation

## 2021-09-10 DIAGNOSIS — R000 Tachycardia, unspecified: Secondary | ICD-10-CM

## 2021-09-10 DIAGNOSIS — I480 Paroxysmal atrial fibrillation: Secondary | ICD-10-CM

## 2021-09-10 DIAGNOSIS — N9489 Other specified conditions associated with female genital organs and menstrual cycle: Secondary | ICD-10-CM | POA: Insufficient documentation

## 2021-09-10 DIAGNOSIS — K449 Diaphragmatic hernia without obstruction or gangrene: Secondary | ICD-10-CM | POA: Diagnosis not present

## 2021-09-10 DIAGNOSIS — E876 Hypokalemia: Secondary | ICD-10-CM | POA: Diagnosis not present

## 2021-09-10 DIAGNOSIS — R778 Other specified abnormalities of plasma proteins: Secondary | ICD-10-CM | POA: Insufficient documentation

## 2021-09-10 DIAGNOSIS — R072 Precordial pain: Secondary | ICD-10-CM

## 2021-09-10 DIAGNOSIS — R0789 Other chest pain: Secondary | ICD-10-CM

## 2021-09-10 LAB — COMPREHENSIVE METABOLIC PANEL
ALT: 22 U/L (ref 0–44)
AST: 26 U/L (ref 15–41)
Albumin: 4 g/dL (ref 3.5–5.0)
Alkaline Phosphatase: 45 U/L (ref 38–126)
Anion gap: 7 (ref 5–15)
BUN: 9 mg/dL (ref 6–20)
CO2: 21 mmol/L — ABNORMAL LOW (ref 22–32)
Calcium: 9 mg/dL (ref 8.9–10.3)
Chloride: 111 mmol/L (ref 98–111)
Creatinine, Ser: 0.68 mg/dL (ref 0.44–1.00)
GFR, Estimated: >60 mL/min (ref >60)
Glucose, Bld: 129 mg/dL — ABNORMAL HIGH (ref 70–99)
Potassium: 3.4 mmol/L — ABNORMAL LOW (ref 3.5–5.1)
Sodium: 139 mmol/L (ref 135–145)
Total Bilirubin: 0.4 mg/dL (ref 0.3–1.2)
Total Protein: 7.4 g/dL (ref 6.5–8.1)

## 2021-09-10 LAB — CBC
HCT: 43.5 % (ref 36.0–46.0)
Hemoglobin: 15 g/dL (ref 12.0–15.0)
MCH: 30.4 pg (ref 26.0–34.0)
MCHC: 34.5 g/dL (ref 30.0–36.0)
MCV: 88.2 fL (ref 80.0–100.0)
Platelets: 245 10*3/uL (ref 150–400)
RBC: 4.93 MIL/uL (ref 3.87–5.11)
RDW: 12.3 % (ref 11.5–15.5)
WBC: 8.1 10*3/uL (ref 4.0–10.5)
nRBC: 0 % (ref 0.0–0.2)

## 2021-09-10 LAB — LIPASE, BLOOD: Lipase: 37 U/L (ref 11–51)

## 2021-09-10 LAB — HCG, QUANTITATIVE, PREGNANCY: hCG, Beta Chain, Quant, S: <1 m[IU]/mL (ref <5)

## 2021-09-10 LAB — D-DIMER, QUANTITATIVE: D-Dimer, Quant: 0.51 ug/mL-FEU — ABNORMAL HIGH (ref 0.00–0.50)

## 2021-09-10 LAB — TROPONIN I (HIGH SENSITIVITY)
Troponin I (High Sensitivity): <2 ng/L (ref <18)
Troponin I (High Sensitivity): 3 ng/L (ref <18)
Troponin I (High Sensitivity): 2 ng/L (ref <18)

## 2021-09-10 LAB — TSH: TSH: 1.541 u[IU]/mL (ref 0.350–4.500)

## 2021-09-10 MED ORDER — LIDOCAINE VISCOUS HCL 2 % MT SOLN
15.0000 mL | Freq: Once | OROMUCOSAL | Status: AC
  Administered 2021-09-10: 15 mL via ORAL
  Filled 2021-09-10: qty 15

## 2021-09-10 MED ORDER — IOHEXOL 350 MG/ML SOLN
80.0000 mL | Freq: Once | INTRAVENOUS | Status: AC | PRN
  Administered 2021-09-10: 80 mL via INTRAVENOUS

## 2021-09-10 MED ORDER — SUCRALFATE 1 G PO TABS: 1.0000 g | ORAL_TABLET | Freq: Three times a day (TID) | ORAL | 0 refills | Status: DC

## 2021-09-10 MED ORDER — ALUM & MAG HYDROXIDE-SIMETH 200-200-20 MG/5ML PO SUSP
30.0000 mL | Freq: Once | ORAL | Status: AC
  Administered 2021-09-10: 30 mL via ORAL
  Filled 2021-09-10: qty 30

## 2021-09-10 MED ORDER — DILTIAZEM HCL ER COATED BEADS 240 MG PO CP24: 240.0000 mg | ORAL_CAPSULE | Freq: Every day | ORAL | 3 refills | Status: DC

## 2021-09-10 MED ORDER — PANTOPRAZOLE SODIUM 40 MG IV SOLR
40.0000 mg | Freq: Once | INTRAVENOUS | Status: AC
  Administered 2021-09-10: 40 mg via INTRAVENOUS
  Filled 2021-09-10: qty 10

## 2021-09-10 MED ORDER — NITROGLYCERIN 0.4 MG SL SUBL: 0.4000 mg | SUBLINGUAL_TABLET | SUBLINGUAL | 0 refills | Status: AC | PRN

## 2021-09-10 MED ORDER — PANTOPRAZOLE SODIUM 20 MG PO TBEC: 20.0000 mg | DELAYED_RELEASE_TABLET | Freq: Every day | ORAL | 0 refills | Status: DC

## 2021-09-10 MED ORDER — ACETAMINOPHEN 500 MG PO TABS
1000.0000 mg | ORAL_TABLET | Freq: Once | ORAL | Status: AC
  Administered 2021-09-10: 1000 mg via ORAL
  Filled 2021-09-10: qty 2

## 2021-09-10 NOTE — Telephone Encounter (Signed)
Pt c/o of Chest Pain: STAT if CP now or developed within 24 hours ? ?1. Are you having CP right now? yes ? ?2. Are you experiencing any other symptoms (ex. SOB, nausea, vomiting, sweating)? Nausea radiates to arm neck shoulder  ? ?3. How long have you been experiencing CP? 1 month increased frequency  ? ?4. Is your CP continuous or coming and going? Intermittent  ? ?5. Have you taken Nitroglycerin? No  ? ? ?? ? ?

## 2021-09-10 NOTE — Discharge Instructions (Addendum)
Follow-up with your cardiac CT and if things are changing getting worse please return to the ER.  Also given you GI number to follow-up with outpatient ?

## 2021-09-10 NOTE — Progress Notes (Signed)
?Cardiology Office Note:   ? ?Date:  09/10/2021  ? ?ID:  Kelly [name], DOB January 14, 1981, MRN [id number] ? ?PCP:  Dion Body, MD  ?[facility] Cardiologist:  None  ?[facility] Electrophysiologist:  Vickie Epley, MD  ? ?Referring MD: Dion Body, MD  ? ?Chief Complaint: chest pain ? ?History of Present Illness:   ? ?Kelly [name] is a 41 y.o. female with a hx of paroxysmal Afib, PCOS, HTN who presents for chest pain.  ? ?Afib was diagnosed back in February 2022 when she presented to the ER with palpitations found to be in rapid Afib. She spontaneously converted to normal rhythm. She had another ER visit in August and November 2022.  ? ?Sw EP 06/04/21 . EKGs reviewed showed sinus tachycardia and not afib. Suspected only 1 real episodes of Afib. Continued cardizem.  ? ?Today, the patient reports she has been having intermittent chest pain for the last month. Its is centralized, described as a sharp pain. Felt aching and pressure this week. Seems to be progressively getting worse. Last night while laying, the pain started strongly and went down the shoulder and arm. BP was higher than normal 140/88. Today she feels someone is sitting on her chest pain. Some pain on inspiration. Pain is not worse with exertion. She did not take Aspirin or NTG.  ? ?Family history with CAD in mother with stents in her 101s. She has chest pain right now. Did not want to go to the ER.Seems to have chest pain episodes eery couple months with elevated heart rate elevated BP. Seem to be related to menstrual cycle. Started on birth control pills, which somewhat helped. Thyroid function was normal. ? ? ?Past Medical History:  ?Diagnosis Date  ? Anxiety   ? Atrial fibrillation ([place])   ? Depression   ? Hypertension   ? PONV (postoperative nausea and vomiting)   ? ? ?Past Surgical History:  ?Procedure Laterality Date  ? CESAREAN SECTION N/A   ? X2  ? CHOLECYSTECTOMY N/A 01/11/2015  ? Procedure: LAPAROSCOPIC  CHOLECYSTECTOMY WITH INTRAOPERATIVE CHOLANGIOGRAM;  Surgeon: Dia Crawford III, MD;  Location: [facility] ORS;  Service: General;  Laterality: N/A;  ? ? ?Current Medications: ?Current Meds  ?Medication Sig  ? aspirin EC 81 MG EC tablet Take 1 tablet (81 mg total) by mouth daily. Swallow whole.  ? levonorgestrel-ethinyl estradiol (SEASONALE) 0.15-0.03 MG tablet Take 1 tablet by mouth daily.  ? loratadine (CLARITIN) 10 MG tablet Take 10 mg by mouth at bedtime.  ? [DISCONTINUED] diltiazem (CARDIZEM CD) 180 MG 24 hr capsule Take 180 mg by mouth daily.  ?  ? ?Allergies:   Patient has no known allergies.  ? ?Social History  ? ?Socioeconomic History  ? Marital status: Married  ?  Spouse name: Not on file  ? Number of children: Not on file  ? Years of education: Not on file  ? Highest education level: Not on file  ?Occupational History  ? Not on file  ?Tobacco Use  ? Smoking status: Never  ? Smokeless tobacco: Never  ?Substance and Sexual Activity  ? Alcohol use: No  ? Drug use: No  ? Sexual activity: Yes  ?Other Topics Concern  ? Not on file  ?Social History Narrative  ? Not on file  ? ?Social Determinants of Health  ? ?Financial Resource Strain: Not on file  ?Food Insecurity: Not on file  ?Transportation Needs: Not on file  ?Physical Activity: Not on file  ?Stress: Not on file  ?  Social Connections: Not on file  ?  ? ?Family History: ?The patient's family history includes Diabetes in her mother; Heart disease in her father; Hyperlipidemia in her mother; Hypertension in her mother. There is no history of Breast cancer. ? ?ROS:   ?Please see the history of present illness.    ? All other systems reviewed and are negative. ? ?EKGs/Labs/Other Studies Reviewed:   ? ?The following studies were reviewed today: ? ?Heart monitor 2023 ?HR 49 - 159 bpm, average 78 bpm. ?Rare supraventricular and ventricular ectopy. ?No sustained arrhythmias. ?Patient triggered episodes corresponded to sinus rhythm. ?  ?Lysbeth Galas T. Quentin Ore, MD, [facility],  [place] ?Cardiac Electrophysiology ? ?Echo 08/2020 ? 1. Left ventricular ejection fraction, by estimation, is 55 to 60%. The  ?left ventricle has normal function. The left ventricle has no regional  ?wall motion abnormalities. Left ventricular diastolic parameters were  ?normal.  ? 2. Right ventricular systolic function is normal. The right ventricular  ?size is normal.  ? 3. The mitral valve is grossly normal. No evidence of mitral valve  ?regurgitation.  ? 4. The aortic valve is tricuspid. Aortic valve regurgitation is not  ?visualized.  ? ?EKG:  EKG is Sinus tachycardia ordered today.  The ekg ordered today demonstrates Sinus tachycardia, nonspecific T wave changes, no new changes ? ?Recent Labs: ?05/18/2021: BUN 9; Creatinine, Ser 0.70; Hemoglobin 16.5; Platelets 261; Potassium 3.6; Sodium 139  ?Recent Lipid Panel ?No results found for: CHOL, TRIG, HDL, CHOLHDL, VLDL, LDLCALC, LDLDIRECT ? ? ?Physical Exam:   ? ?VS:  BP 140/84 (BP Location: Left Arm, Patient Position: Sitting, Cuff Size: Normal)   Pulse (!) 116   Ht 5\' 10"  (1.778 m)   Wt 224 lb 8 oz (101.8 kg)   SpO2 98%   BMI 32.21 kg/m?    ? ?Wt Readings from Last 3 Encounters:  ?09/10/21 224 lb 8 oz (101.8 kg)  ?06/04/21 244 lb (110.7 kg)  ?05/18/21 240 lb (108.9 kg)  ?  ? ?GEN:  Well nourished, well developed in no acute distress ?HEENT: Normal ?NECK: No JVD; No carotid bruits ?LYMPHATICS: No lymphadenopathy ?CARDIAC: tachycardic, RR, no murmurs, rubs, gallops ?RESPIRATORY:  Clear to auscultation without rales, wheezing or rhonchi  ?ABDOMEN: Soft, non-tender, non-distended ?MUSCULOSKELETAL:  No edema; No deformity  ?SKIN: Warm and dry ?NEUROLOGIC:  Alert and oriented x 3 ?PSYCHIATRIC:  Normal affect  ? ?ASSESSMENT:   ? ?1. Precordial pain   ?2. Paroxysmal A-fib ([place])   ?3. Tachycardia   ? ?PLAN:   ? ?In order of problems listed above: ? ?Chest pain ?Patient reports episodes of chest pain over the last few months with 2 ER visits with negative troponin and  overall negative work-up. Episodes sound atypical in nature, pain is not worse on exertion, some pleuritic symptoms as well, also have some relaion to her menstration. EKG shows sinus tachycardia with heart rate 116bpm with no ischemic changes. CT scan in 2022 showed no significant vascular findings. I will increase diltiazem to 240mg  daily and give NTG x10 (instructions given). I will get stat labs HS troponin, D-dimer, CMET, CBC, TSH. I will get cardiac CTA and CXR. We will see her back in 6-8 weeks. Also recommended to continue to explore non-cardiac etiologies of chest pain. May also benefit from anxiety medications.  ? ?Paroxysmal Afib ?Saw Dr. Quentin Ore in November 2020 and he suspects 1 prior episode of Afib with no known recurrence. EKG today with sinus tach. Continue diltiazem for rate control. She will continue to  follow with EP.  ? ?Tachycardia ?Sinus tachycardia on EKG with a heart rate of 116bpm. Labs as above and increase diltiazem.  ? ?Disposition: Follow up in 6-8  week(s) with MD/APP  ? ? ? ? ?Signed, ?Tamikia Chowning Ninfa Meeker, PA-C  ?09/10/2021 3:42 PM    ?[place]  ?

## 2021-09-10 NOTE — ED Triage Notes (Addendum)
Pt comes into the ED via POV c/o chest pain.  Pt saw Heart clinic today and they found that the patient had a positive D-dimer.  Pt states it started with central chest pain and SHOB with exertion.  Pt currently in NAD with even and unlabored respirations. Pt has a h/o a-fib. Pt already had blood work and DG chest completed today.  ?

## 2021-09-10 NOTE — Telephone Encounter (Addendum)
Patient stated that she is currently having an aching in her chest and that last night it started as a sharp pain and pressure that radiated down her neck and arm. She stated that her BP was 140/88. I advised her that with they symptoms she was having we would strongly advise her to go to the ED. Patient stated that she just wanted to see Dr. Lalla Brothers, and that she wanted to review the monitor she wore in January 2023 because he did not address the episodes she had while she wore it. I reviewed her report with her and Dr. Geannie Risen result note that the monitor was completely normal. There were nothing of concern noted on the report. Patient stated that she did not want to go to the ED, she just wants an appointment with Dr. Lalla Brothers. I informed her that Dr. Lovena Neighbours first available appointment is not until the middle of April, unless she would like to be seen in [place]. Patient wanted sooner in [place] and is now scheduled to see Graciella Freer, PA-C on 09/17/21. I again emphasized the importance of going to the ED if her symptoms persist. ? ? ?Patients MyChart message: ? ?Pt c/o of Chest Pain: STAT if CP now or developed within 24 hours ?  ?1. Are you having CP right now? yes ?  ?2. Are you experiencing any other symptoms (ex. SOB, nausea, vomiting, sweating)? Nausea radiates to arm neck shoulder  ?  ?3. How long have you been experiencing CP? 1 month increased frequency  ?  ?4. Is your CP continuous or coming and going? Intermittent  ?  ?5. Have you taken Nitroglycerin? No  ?  ?

## 2021-09-10 NOTE — Patient Instructions (Signed)
Medication Instructions:  ?Your physician has recommended you make the following change in your medication:  ? ?INCREASE ditiazem (Cardizem CD) to 240 mg daily  ? ?START taking nitroglycerin 0.4 mg every 5 minutes as needed for chest pain. If you are taking a 3rd dose please call 911 or proceed to the nearest emergency room  ? ?*If you need a refill on your cardiac medications before your next appointment, please call your pharmacy* ? ? ?Lab Work: ? ?Your provider has ordered stat labs (troponin, d-dimer, TSH, CBC, CMET), please go to the [facility] to have these drawn.  ? ?[facility] at [facility] ?1st desk on the right to check in, past the screening table ?Lab hours: Monday- Friday (7:30 am- 5:30 pm)  ? ?If you have labs (blood work) drawn today and your tests are completely normal, you will receive your results only by: ?MyChart Message (if you have MyChart) OR ?A paper copy in the mail ?If you have any lab test that is abnormal or we need to change your treatment, we will call you to review the results. ? ? ?Testing/Procedures: ?Your provider has ordered a chest x ray. You may have this completed at the [facility] Monday through Friday 8-5.  ? ?Your cardiac CT has been scheduled for March 13th, 2023 at 11 am at:  ? ?[facility] ?[address] ?[address] ?[place], [place] [address] ?[phone] ? ?Please arrive 15 mins early for check-in and test prep. ? ? ? ?Please follow these instructions carefully (unless otherwise directed): ? ?On the Night Before the Test: ?Be sure to Drink plenty of water. ?Do not consume any caffeinated/decaffeinated beverages or chocolate 12 hours prior to your test. ? ?On the Day of the Test: ?Drink plenty of water until 1 hour prior to the test. ?Do not eat any food 4 hours prior to the test. ?You may take your regular medications prior to the test.  ?FEMALES- please wear underwire-free bra if available, avoid dresses & tight clothing ? ?      ?After the Test: ?Drink plenty of water. ?After receiving IV contrast, you may experience a mild flushed feeling. This is normal. ?On occasion, you may experience a mild rash up to 24 hours after the test. This is not dangerous. If this occurs, you can take Benadryl 25 mg and increase your fluid intake. ?If you experience trouble breathing, this can be serious. If it is severe call 911 IMMEDIATELY. If it is mild, please call our office. ? ?Please allow 2-4 weeks for scheduling of routine cardiac CTs. Some insurance companies require a pre-authorization which may delay scheduling of this test.  ? ?For non-scheduling related questions, please contact the cardiac imaging nurse navigator should you have any questions/concerns: ?Rockwell Alexandria, Cardiac Imaging Nurse Navigator ?Larey Brick, Cardiac Imaging Nurse Navigator ?[facility] ?Direct Office Dial: [phone]  ? ?For scheduling needs, including cancellations and rescheduling, please call [place], [phone].   ? ? ?Follow-Up: ?At [facility], you and your health needs are our priority.  As part of our continuing mission to provide you with exceptional heart care, we have created designated Provider Care Teams.  These Care Teams include your primary Cardiologist (physician) and Advanced Practice Providers (APPs -  Physician Assistants and Nurse Practitioners) who all work together to provide you with the care you need, when you need it. ? ?We recommend signing up for the patient portal called "MyChart".  Sign up information is  provided on this After Visit Summary.  MyChart is used to connect with patients for Virtual Visits (Telemedicine).  Patients are able to view lab/test results, encounter notes, upcoming appointments, etc.  Non-urgent messages can be sent to your provider as well.   ?To learn more about what you can do with MyChart, go to [email or web address].   ? ?Your next appointment:   ?1 month(s) ? ?The format for  your next appointment:   ?In Person ? ?Provider:   ?You may see Steffanie Dunn, MD or one of the following Advanced Practice Providers on your designated Care Team:   ?Nicolasa Ducking, NP ?Eula Listen, PA-C ?Cadence Fransico Michael, PA-C ? ? ?Other Instructions ?N/A ?

## 2021-09-10 NOTE — ED Provider Notes (Signed)
? ?[facility] ?Provider Note ? ? ? Event Date/Time  ? First MD Initiated Contact with Patient 09/10/21 1914   ?  (approximate) ? ? ?History  ? ?Chest Pain ? ? ?HPI ? ?Kelly [name] is a 41 y.o. female with A-fib, PCOS, hypertension who comes in with chest pain.  Patient is on Cardizem for her A-fib.  Patient had a D-dimer ordered therefore patient was sent over here for further evaluation.  I reviewed the chest x-ray from today without evidence of pneumothorax.  TSH was normal.  D-dimer slightly elevated.  Troponin was negative.  CMP shows slightly low potassium at 3.4.  CBC normal.  Patient states that she has had intermittent chest pain that is sharp, stabbing in nature starting in her midsternal chest going into her back with no shortness of breath but patient was on estrogen and slightly tachycardic so patient had D-dimer ordered that was slightly positive at 0.51 and told to come to the emergency room to be evaluated for pulmonary embolism.  She reports that these episodes of been going on intermittently for a month but the last episode started yesterday.  She reports taking some Tylenol yesterday without any relief.  She is already had her gallbladder removed. ? ? ? ? ?Physical Exam  ? ?Triage Vital Signs: ?ED Triage Vitals  ?Enc Vitals Group  ?   BP 09/10/21 1850 (!) 157/106  ?   Pulse Rate 09/10/21 1850 91  ?   Resp 09/10/21 1850 18  ?   Temp 09/10/21 1850 98.3 ?F (36.8 ?C)  ?   Temp Source 09/10/21 1850 Oral  ?   SpO2 09/10/21 1850 100 %  ?   Weight 09/10/21 1848 222 lb 10.6 oz (101 kg)  ?   Height 09/10/21 1848 5\' 10"  (1.778 m)  ?   Head Circumference --   ?   Peak Flow --   ?   Pain Score 09/10/21 1848 2  ?   Pain Loc --   ?   Pain Edu? --   ?   Excl. in GC? --   ? ? ?Most recent vital signs: ?Vitals:  ? 09/10/21 1850  ?BP: (!) 157/106  ?Pulse: 91  ?Resp: 18  ?Temp: 98.3 ?F (36.8 ?C)  ?SpO2: 100%  ? ? ? ?General: Awake, no distress.  ?CV:  Good peripheral perfusion.   ?Resp:  Normal effort.  ?Abd:  No distention.  ?Other:  Good pulses  ? ? ?ED Results / Procedures / Treatments  ? ?Labs ?(all labs ordered are listed, but only abnormal results are displayed) ?Labs Reviewed  ?HCG, QUANTITATIVE, PREGNANCY  ?LIPASE, BLOOD  ?TROPONIN I (HIGH SENSITIVITY)  ?TROPONIN I (HIGH SENSITIVITY)  ? ? ? ?EKG ? ?My interpretation of EKG: ?Normal sinus with a rate of 90 without any ST elevation or T wave inversions, normal intervals ? ? ?RADIOLOGY ?I have reviewed the CT personally-no evidence of PE.  Small hiatal hernia ? ? ?PROCEDURES: ? ?Critical Care performed: No ? ?Procedures ? ? ?MEDICATIONS ORDERED IN ED: ?Medications - No data to display ? ? ?IMPRESSION / MDM / ASSESSMENT AND PLAN / ED COURSE  ?I reviewed the triage vital signs and the nursing notes. ?             ?               ? ?Patient comes in with concerns for elevated D-dimer with some chest pain.  CT scan ordered without evidence  of pulmonary embolism and no evidence of dissection.  There is a small hiatal hernia so I did give her GI cocktail, Tylenol and she reports symptoms got a lot better.  I discussed with patient this could be multifactorial in nature and recommended talking to GI as well.  We discussed admission versus going home but patient would prefer to go home and she already has a plan for cardiac CT on Monday.  She understands she develops worsening pain to return to the ER.  I considered admission but given her cardiac markers are now negative x3 she is low risk for ACS and patient feeling better seems reasonable to go home.  We will start her on PPI, Carafate in case this could be related to acid reflux, GI ? ? ? ?FINAL CLINICAL IMPRESSION(S) / ED DIAGNOSES  ? ?Final diagnoses:  ?Atypical chest pain  ? ? ? ?Rx / DC Orders  ? ?ED Discharge Orders   ? ?      Ordered  ?  pantoprazole (PROTONIX) 20 MG tablet  Daily       ? 09/10/21 2229  ?  sucralfate (CARAFATE) 1 g tablet  3 times daily with meals & bedtime       ?  09/10/21 2229  ? ?  ?  ? ?  ? ? ? ?Note:  This document was prepared using Dragon voice recognition software and may include unintentional dictation errors. ?  ?Concha Se, MD ?09/10/21 2229 ? ?

## 2021-09-12 ENCOUNTER — Telehealth (HOSPITAL_COMMUNITY): Payer: Self-pay | Admitting: *Deleted

## 2021-09-12 NOTE — Telephone Encounter (Signed)
Reaching out to patient to offer assistance regarding upcoming cardiac imaging study; pt verbalizes understanding of appt date/time, parking situation and where to check in, pre-test NPO status and verified current allergies; name and call back number provided for further questions should they arise ? ?Gordy Clement RN Navigator Cardiac Imaging ?[facility] ?[phone] office ?[phone] cell ? ?Patient take her 240mg  of cardizem in the evening. States she prefers not to take metoprolol due to how the medications has made her felt in the past.  ?

## 2021-09-12 NOTE — Telephone Encounter (Signed)
Attempted to call patient regarding upcoming cardiac CT appointment. °Left message on voicemail with name and callback number ° °Juliene Kirsh RN Navigator Cardiac Imaging °[facility] °[phone] Office °[phone] Cell ° °

## 2021-09-15 ENCOUNTER — Other Ambulatory Visit: Payer: Self-pay

## 2021-09-15 ENCOUNTER — Ambulatory Visit
Admission: RE | Admit: 2021-09-15 | Discharge: 2021-09-15 | Disposition: A | Discharge status: Home / Self Care | Payer: No Typology Code available for payment source | Source: Ambulatory Visit | Attending: Medical | Admitting: Medical

## 2021-09-15 DIAGNOSIS — R072 Precordial pain: Secondary | ICD-10-CM

## 2021-09-15 MED ORDER — DILTIAZEM HCL 25 MG/5ML IV SOLN
10.0000 mg | Freq: Once | INTRAVENOUS | Status: AC
  Administered 2021-09-15: 10 mg via INTRAVENOUS

## 2021-09-15 MED ORDER — METOPROLOL TARTRATE 5 MG/5ML IV SOLN
10.0000 mg | Freq: Once | INTRAVENOUS | Status: AC
  Administered 2021-09-15: 10 mg via INTRAVENOUS

## 2021-09-15 MED ORDER — METOPROLOL TARTRATE 100 MG PO TABS
100.0000 mg | ORAL_TABLET | Freq: Once | ORAL | Status: AC
  Administered 2021-09-15: 100 mg via ORAL

## 2021-09-15 MED ORDER — IOHEXOL 350 MG/ML SOLN
80.0000 mL | Freq: Once | INTRAVENOUS | Status: AC | PRN
  Administered 2021-09-15: 80 mL via INTRAVENOUS

## 2021-09-15 MED ORDER — NITROGLYCERIN 0.4 MG SL SUBL
0.8000 mg | SUBLINGUAL_TABLET | SUBLINGUAL | Status: DC | PRN
  Administered 2021-09-15: 0.8 mg via SUBLINGUAL

## 2021-09-15 NOTE — Progress Notes (Signed)
Patient ID: Kelly [name], female   DOB: 21-Oct-1980, 41 y.o.   MRN: [id number] ? ?Patient presented for her cardiac CT scan.  Pt tolerated medications and scan without incident.  Pt does report feeling tired and was encouraged to drink caffeine to help.  Patient left with spouse. ABC's intact. ?

## 2021-09-17 ENCOUNTER — Ambulatory Visit: Payer: No Typology Code available for payment source | Admitting: Student

## 2021-09-18 ENCOUNTER — Telehealth: Payer: Self-pay | Admitting: Emergency Medicine

## 2021-09-18 NOTE — Telephone Encounter (Signed)
Patient returning call.

## 2021-09-18 NOTE — Telephone Encounter (Signed)
Called patient to go over results. No answer, lmtcb.  ?

## 2021-09-18 NOTE — Telephone Encounter (Signed)
Called and spoke with patient. Results reviewed with patient, pt verbalized understanding,  questions (if any) answered.   ?

## 2021-09-18 NOTE — Telephone Encounter (Signed)
-----   Message from Cadence David Stall, PA-C sent at 09/16/2021 12:49 PM EDT ----- ?Coronary CT showed calcium score of 0, no evidence of CAD. Overall reassuring for not cardiac pain.  ?

## 2021-09-19 DIAGNOSIS — E876 Hypokalemia: Secondary | ICD-10-CM

## 2021-09-19 MED ORDER — POTASSIUM CHLORIDE CRYS ER 20 MEQ PO TBCR: 80.0000 meq | EXTENDED_RELEASE_TABLET | Freq: Once | ORAL | 0 refills | Status: DC

## 2021-09-29 ENCOUNTER — Other Ambulatory Visit: Payer: Self-pay

## 2021-09-29 ENCOUNTER — Other Ambulatory Visit (INDEPENDENT_AMBULATORY_CARE_PROVIDER_SITE_OTHER): Payer: No Typology Code available for payment source

## 2021-09-29 DIAGNOSIS — E876 Hypokalemia: Secondary | ICD-10-CM

## 2021-09-30 LAB — BASIC METABOLIC PANEL
BUN/Creatinine Ratio: 10 (ref 9–23)
BUN: 8 mg/dL (ref 6–24)
CO2: 19 mmol/L — ABNORMAL LOW (ref 20–29)
Calcium: 9 mg/dL (ref 8.7–10.2)
Chloride: 107 mmol/L — ABNORMAL HIGH (ref 96–106)
Creatinine, Ser: 0.8 mg/dL (ref 0.57–1.00)
Glucose: 97 mg/dL (ref 70–99)
Potassium: 3.9 mmol/L (ref 3.5–5.2)
Sodium: 142 mmol/L (ref 134–144)
eGFR: 95 mL/min/{1.73_m2} (ref >59)

## 2021-10-15 NOTE — Progress Notes (Signed)
?Cardiology Office Note:   ? ?Date:  10/17/2021  ? ?ID:  Kelly [name], DOB [birth year], MRN [id number] ? ?PCP:  Marisue Ivan, MD  ?[facility] Cardiologist:  None  ?[facility] Electrophysiologist:  Lanier Prude, MD  ? ?Referring MD: Marisue Ivan, MD  ? ?Chief Complaint: 1 month follow-up ? ?History of Present Illness:   ? ?Kelly [name] is a 41 y.o. female with a hx of with a hx of paroxysmal Afib, PCOS, HTN who presents for chest pain.  ?  ?Afib was diagnosed back in February 2022 when she presented to the ER with palpitations found to be in rapid Afib. She spontaneously converted to normal rhythm. She had another ER visit in August and November 2022.  ?  ?Saw EP 06/04/21 . EKGs reviewed showed sinus tachycardia and not afib. Suspected only 1 real episode of Afib. Continued cardizem.  ? ?The patient was last seen 09/10/21 for chest pain with multiple ER visits for chest pain and negative work-up. Diltiazem was incraesed. Stats labs were obtained. She ultimately went to the ER for elevated D-dimer and CT chest was negative for PE.  ? ?Cardiac CTA showed coronary calcium score of 0, no evidence of CAD.  ? ?Today, the patient reports stress has increased with personal things at home. She has a small hernia. She is now on Protonix. She was on karafate, but not any longer. She plans on seeing GI. She is still on diltiazem 180mg  daily, did not increase this to 240mg . She reports chest pain is much improved on Protonix. She is staying off acidic food. She is eating smaller meals.  ? ? ? ?Past Medical History:  ?Diagnosis Date  ? Anxiety   ? Atrial fibrillation (HCC)   ? Depression   ? Hypertension   ? PONV (postoperative nausea and vomiting)   ? ? ?Past Surgical History:  ?Procedure Laterality Date  ? CESAREAN SECTION N/A   ? X2  ? CHOLECYSTECTOMY N/A 01/11/2015  ? Procedure: LAPAROSCOPIC CHOLECYSTECTOMY WITH INTRAOPERATIVE CHOLANGIOGRAM;  Surgeon: III, MD;  Location: [facility] ORS;   Service: General;  Laterality: N/A;  ? ? ?Current Medications: ?Current Meds  ?Medication Sig  ? aspirin EC 81 MG EC tablet Take 1 tablet (81 mg total) by mouth daily. Swallow whole.  ? cyanocobalamin (,VITAMIN B-12,) 1000 MCG/ML injection Inject into the muscle every 30 (thirty) days.  ? diltiazem (TIAZAC) 180 MG 24 hr capsule Take 180 mg by mouth daily.  ? levonorgestrel-ethinyl estradiol (SEASONALE) 0.15-0.03 MG tablet Take 1 tablet by mouth daily.  ? loratadine (CLARITIN) 10 MG tablet Take 10 mg by mouth at bedtime.  ? nitroGLYCERIN (NITROSTAT) 0.4 MG SL tablet Place 1 tablet (0.4 mg total) under the tongue every 5 (five) minutes as needed for chest pain.  ? pantoprazole (PROTONIX) 20 MG tablet Take 1 tablet (20 mg total) by mouth daily for 14 days.  ? sucralfate (CARAFATE) 1 g tablet Take 1 tablet (1 g total) by mouth 4 (four) times daily -  with meals and at bedtime for 14 days.  ?  ? ?Allergies:   Patient has no known allergies.  ? ?Social History  ? ?Socioeconomic History  ? Marital status: Married  ?  Spouse name: Not on file  ? Number of children: Not on file  ? Years of education: Not on file  ? Highest education level: Not on file  ?Occupational History  ? Not on file  ?Tobacco Use  ? Smoking status: Never  ?  Smokeless tobacco: Never  ?Vaping Use  ? Vaping Use: Never used  ?Substance and Sexual Activity  ? Alcohol use: No  ? Drug use: No  ? Sexual activity: Yes  ?Other Topics Concern  ? Not on file  ?Social History Narrative  ? Not on file  ? ?Social Determinants of Health  ? ?Financial Resource Strain: Not on file  ?Food Insecurity: Not on file  ?Transportation Needs: Not on file  ?Physical Activity: Not on file  ?Stress: Not on file  ?Social Connections: Not on file  ?  ? ?Family History: ?The patient's family history includes Diabetes in her mother; Heart disease in her father; Hyperlipidemia in her mother; Hypertension in her mother. There is no history of Breast cancer. ? ?ROS:   ?Please see the  history of present illness.    ? All other systems reviewed and are negative. ? ?EKGs/Labs/Other Studies Reviewed:   ? ?The following studies were reviewed today: ? ?Cardiac CTA 09/2019 ?IMPRESSION: ?1. Normal coronary calcium score of 0. Patient is low risk for ?coronary events. ?  ?2.  Normal coronary origin with right dominance. ?  ?3.  No evidence of CAD. ?  ?4.  CAD-RADS 0. Consider non-atherosclerotic causes of chest pain. ?  ?Electronically Signed: ?By: Debbe Odea M.D. ?On: 09/15/2021 15:57 ? ?Heart monitor 2023 ?HR 49 - 159 bpm, average 78 bpm. ?Rare supraventricular and ventricular ectopy. ?No sustained arrhythmias. ?Patient triggered episodes corresponded to sinus rhythm. ?  ?Sheria Lang T. Lalla Brothers, MD, [facility], [facility] ?Cardiac Electrophysiology ?  ?Echo 08/2020 ? 1. Left ventricular ejection fraction, by estimation, is 55 to 60%. The  ?left ventricle has normal function. The left ventricle has no regional  ?wall motion abnormalities. Left ventricular diastolic parameters were  ?normal.  ? 2. Right ventricular systolic function is normal. The right ventricular  ?size is normal.  ? 3. The mitral valve is grossly normal. No evidence of mitral valve  ?regurgitation.  ? 4. The aortic valve is tricuspid. Aortic valve regurgitation is not  ?visualized.  ? ?EKG:  EKG is  ordered today.  The ekg ordered today demonstrates Nsr 86bpm, TWI III ? ?Recent Labs: ?09/10/2021: ALT 22; Hemoglobin 15.0; Platelets 245; TSH 1.541 ?09/29/2021: BUN 8; Creatinine, Ser 0.80; Potassium 3.9; Sodium 142  ?Recent Lipid Panel ?No results found for: CHOL, TRIG, HDL, CHOLHDL, VLDL, LDLCALC, LDLDIRECT ? ? ? ?Physical Exam:   ? ?VS:  BP 120/82 (BP Location: Left Arm, Patient Position: Sitting, Cuff Size: Normal)   Pulse 86   Ht 5\' 10"  (1.778 m)   Wt 218 lb 6 oz (99.1 kg)   SpO2 99%   BMI 31.33 kg/m?    ? ?Wt Readings from Last 3 Encounters:  ?10/17/21 218 lb 6 oz (99.1 kg)  ?09/10/21 222 lb 10.6 oz (101 kg)  ?09/10/21 224 lb 8 oz (101.8  kg)  ?  ? ?GEN:  Well nourished, well developed in no acute distress ?HEENT: Normal ?NECK: No JVD; No carotid bruits ?LYMPHATICS: No lymphadenopathy ?CARDIAC: RRR, no murmurs, rubs, gallops ?RESPIRATORY:  Clear to auscultation without rales, wheezing or rhonchi  ?ABDOMEN: Soft, non-tender, non-distended ?MUSCULOSKELETAL:  No edema; No deformity  ?SKIN: Warm and dry ?NEUROLOGIC:  Alert and oriented x 3 ?PSYCHIATRIC:  Normal affect  ? ?ASSESSMENT:   ? ?1. Precordial pain   ?2. AF (paroxysmal atrial fibrillation) (HCC)   ?3. Tachycardia   ? ?PLAN:   ? ?In order of problems listed above: ? ?Chest pain ?Chest pain felt to  be from small hiatal hernia/acid-reflux. Symptoms improved on Protonix and lifestyle changes. Also there is a stress component. She has an appointment with GI in May. Cardiac CTA showed no CAD, overall reassuring symptoms are non-cardiac. Continue Aspirin and protonix.  ? ?Paroxysmal Afib ?No further recurrence, in SR on EKG today. Continue diltiazem 180mg  daily for rate control.  ? ?Tachycardia ?Resolved, heat rate today 86bpm. continue diltiazem.  ? ?Disposition: Follow up in 6 month(s) with MD/APP  ? ? ?Signed, ?Ambriella Kitt David StallH Aaralynn Shepheard, PA-C  ?10/17/2021 9:54 AM    ?[facility]  ?

## 2021-10-17 ENCOUNTER — Encounter: Payer: Self-pay | Admitting: Medical

## 2021-10-17 ENCOUNTER — Ambulatory Visit: Payer: No Typology Code available for payment source | Admitting: Medical

## 2021-10-17 VITALS — BP 120/82 | HR 86 | Ht 70.0 in | Wt 218.4 lb

## 2021-10-17 DIAGNOSIS — R072 Precordial pain: Secondary | ICD-10-CM | POA: Diagnosis not present

## 2021-10-17 DIAGNOSIS — R000 Tachycardia, unspecified: Secondary | ICD-10-CM

## 2021-10-17 DIAGNOSIS — I480 Paroxysmal atrial fibrillation: Secondary | ICD-10-CM

## 2021-10-17 NOTE — Patient Instructions (Signed)
Medication Instructions:  ?Your physician recommends that you continue on your current medications as directed. Please refer to the Current Medication list given to you today. ? ?*If you need a refill on your cardiac medications before your next appointment, please call your pharmacy* ? ? ?Lab Work: ?None ordered ?If you have labs (blood work) drawn today and your tests are completely normal, you will receive your results only by: ?MyChart Message (if you have MyChart) OR ?A paper copy in the mail ?If you have any lab test that is abnormal or we need to change your treatment, we will call you to review the results. ? ? ?Testing/Procedures: ?None ordered ? ? ?Follow-Up: ?At [facility], you and your health needs are our priority.  As part of our continuing mission to provide you with exceptional heart care, we have created designated Provider Care Teams.  These Care Teams include your primary Cardiologist (physician) and Advanced Practice Providers (APPs -  Physician Assistants and Nurse Practitioners) who all work together to provide you with the care you need, when you need it. ? ?We recommend signing up for the patient portal called "MyChart".  Sign up information is provided on this After Visit Summary.  MyChart is used to connect with patients for Virtual Visits (Telemedicine).  Patients are able to view lab/test results, encounter notes, upcoming appointments, etc.  Non-urgent messages can be sent to your provider as well.   ?To learn more about what you can do with MyChart, go to [email or web address].   ? ?Your next appointment:   ?6 month(s) ? ?The format for your next appointment:   ?In Person ? ?Provider:   ?You may see Steffanie Dunn, MD or one of the following Advanced Practice Providers on your designated Care Team:   ?Nicolasa Ducking, NP ?Eula Listen, PA-C ?Cadence Fransico Michael, PA-C{ ?  ? ? ?Other Instructions ?N/A ? ?Important Information About Sugar ? ? ? ? ? ? ?

## 2021-12-08 DIAGNOSIS — E559 Vitamin D deficiency, unspecified: Secondary | ICD-10-CM | POA: Insufficient documentation

## 2021-12-08 DIAGNOSIS — E538 Deficiency of other specified B group vitamins: Secondary | ICD-10-CM | POA: Insufficient documentation

## 2021-12-22 ENCOUNTER — Ambulatory Visit: Payer: No Typology Code available for payment source | Admitting: Gastroenterology

## 2021-12-22 ENCOUNTER — Encounter: Payer: Self-pay | Admitting: Gastroenterology

## 2021-12-22 ENCOUNTER — Other Ambulatory Visit: Payer: Self-pay

## 2021-12-22 VITALS — BP 126/83 | HR 85 | Temp 99.1°F | Ht 70.0 in | Wt 220.5 lb

## 2021-12-22 DIAGNOSIS — R0789 Other chest pain: Secondary | ICD-10-CM | POA: Diagnosis not present

## 2021-12-22 DIAGNOSIS — E538 Deficiency of other specified B group vitamins: Secondary | ICD-10-CM

## 2021-12-22 MED ORDER — OMEPRAZOLE 40 MG PO CPDR: 40.0000 mg | DELAYED_RELEASE_CAPSULE | Freq: Two times a day (BID) | ORAL | 2 refills | Status: DC

## 2021-12-22 NOTE — Progress Notes (Signed)
Arlyss Repress, MD [address]  [address]  [place], [place] [address]  Main: [phone]  Fax: [phone]    Gastroenterology Consultation  Referring Provider:     Marisue Ivan, MD Primary Care Physician:  Marisue Ivan, MD Primary Gastroenterologist:  Dr. Arlyss Repress Reason for Consultation: Atypical chest pain, chest pressure        HPI:   Kelly [name] is a 41 y.o. female referred by Dr. Marisue Ivan, MD  for consultation & management of atypical chest pain, chest pressure.  Patient reports that since beginning of this year, she has been experiencing chest pain, pressure.  She underwent cardiac work-up which was unrevealing.  She had paroxysmal A-fib, currently.  She underwent CT angio PE protocol which was negative.  She was found to have small hiatal hernia.  She also underwent CT coronary angiogram which was normal.  Patient is therefore referred to GI for further evaluation of atypical chest pain.  Patient was taking Protonix 20 mg daily and she also received sucralfate for a short course and noticed mild improvement in her symptoms.  Patient reports that she has been going through a lot of stress lately taking care of her mom who has moderate dementia and lives with her.  Her husband is a Emergency planning/management officer.  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 Patient reports that her bowel movements have been kind of irregular lately.  She denies any abdominal pain, nausea or vomiting.  She is status postcholecystectomy secondary to acute calculus cholecystitis in 2016 Labs including CBC, CMP are unremarkable  Patient does not smoke or drink alcohol She does not smoke marijuana or vape  NSAIDs: None  Antiplts/Anticoagulants/Anti thrombotics: None  GI Procedures: Reports undergoing upper endoscopy and colonoscopy at age 74  Past Medical History:  Diagnosis Date   Anxiety    Atrial fibrillation (HCC)    Depression    Hypertension     PONV (postoperative nausea and vomiting)     Past Surgical History:  Procedure Laterality Date   CESAREAN SECTION N/A    X2   CHOLECYSTECTOMY N/A 01/11/2015   Procedure: LAPAROSCOPIC CHOLECYSTECTOMY WITH INTRAOPERATIVE CHOLANGIOGRAM;  Surgeon: Tiney Rouge III, MD;  Location: [facility] ORS;  Service: General;  Laterality: N/A;     Current Outpatient Medications:    aspirin EC 81 MG EC tablet, Take 1 tablet (81 mg total) by mouth daily. Swallow whole., Disp: 30 tablet, Rfl: 0   cyanocobalamin (,VITAMIN B-12,) 1000 MCG/ML injection, Inject into the muscle., Disp: , Rfl:    diltiazem (CARDIZEM CD) 180 MG 24 hr capsule, Take 180 mg by mouth daily., Disp: , Rfl:    levonorgestrel-ethinyl estradiol (SEASONALE) 0.15-0.03 MG tablet, Take 1 tablet by mouth daily., Disp: , Rfl:    loratadine (CLARITIN) 10 MG tablet, Take 10 mg by mouth at bedtime., Disp: , Rfl:    nitroGLYCERIN (NITROSTAT) 0.4 MG SL tablet, Place 1 tablet (0.4 mg total) under the tongue every 5 (five) minutes as needed for chest pain., Disp: 10 tablet, Rfl: 0   omeprazole (PRILOSEC) 40 MG capsule, Take 1 capsule (40 mg total) by mouth in the morning and at bedtime., Disp: 60 capsule, Rfl: 2   Family History  Problem Relation Age of Onset   Diabetes Mother    Hypertension Mother    Hyperlipidemia Mother    Heart disease Father    Breast cancer Neg Hx      Social History  Tobacco Use   Smoking status: Never   Smokeless tobacco: Never  Vaping Use   Vaping Use: Never used  Substance Use Topics   Alcohol use: No   Drug use: No    Allergies as of 12/22/2021   (No Known Allergies)    Review of Systems:    All systems reviewed and negative except where noted in HPI.   Physical Exam:  BP 126/83 (BP Location: Left Arm, Patient Position: Sitting, Cuff Size: Normal)   Pulse 85   Temp 99.1 F (37.3 C) (Oral)   Ht 5\' 10"  (1.778 m)   Wt 220 lb 8 oz (100 kg)   BMI 31.64 kg/m  No LMP recorded.  General:   Alert,   Well-developed, well-nourished, pleasant and cooperative in NAD Head:  Normocephalic and atraumatic. Eyes:  Sclera clear, no icterus.   Conjunctiva pink. Ears:  Normal auditory acuity. Nose:  No deformity, discharge, or lesions. Mouth:  No deformity or lesions,oropharynx pink & moist. Neck:  Supple; no masses or thyromegaly. Lungs:  Respirations even and unlabored.  Clear throughout to auscultation.   No wheezes, crackles, or rhonchi. No acute distress. Heart:  Regular rate and rhythm; no murmurs, clicks, rubs, or gallops. Abdomen:  Normal bowel sounds. Soft, non-tender and non-distended without masses, hepatosplenomegaly or hernias noted.  No guarding or rebound tenderness.   Rectal: Not performed Msk:  Symmetrical without gross deformities. Good, equal movement & strength bilaterally. Pulses:  Normal pulses noted. Extremities:  No clubbing or edema.  No cyanosis. Neurologic:  Alert and oriented x3;  grossly normal neurologically. Skin:  Intact without significant lesions or rashes. No jaundice. Psych:  Alert and cooperative. Normal mood and affect.  Imaging Studies: Reviewed  Assessment and Plan:   Kelly [name] is a 41 y.o. pleasant Caucasian female with history of anxiety, depression, A-fib is seen in consultation for atypical chest pain/pressure.  Cardiac work-up has been unremarkable.  CT angio PE protocol negative.  Atypical chest pain/pressure: Small hiatal hernia Recommend EGD with esophageal biopsies and gastric biopsies Switch to omeprazole 40 mg p.o. twice daily before meals  Vitamin B12 deficiency Recommend gastric biopsies to evaluate for pernicious anemia  Follow-up in 4 months   41, MD

## 2021-12-22 NOTE — Patient Instructions (Signed)
Gastroesophageal Reflux Disease, Adult  Gastroesophageal reflux (GER) happens when acid from the stomach flows up into the tube that connects the mouth and the stomach (esophagus). Normally, food travels down the esophagus and stays in the stomach to be digested. With GER, food and stomach acid sometimes move back up into the esophagus. You may have a disease called gastroesophageal reflux disease (GERD) if the reflux: Happens often. Causes frequent or very bad symptoms. Causes problems such as damage to the esophagus. When this happens, the esophagus becomes sore and swollen. Over time, GERD can make small holes (ulcers) in the lining of the esophagus. What are the causes? This condition is caused by a problem with the muscle between the esophagus and the stomach. When this muscle is weak or not normal, it does not close properly to keep food and acid from coming back up from the stomach. The muscle can be weak because of: Tobacco use. Pregnancy. Having a certain type of hernia (hiatal hernia). Alcohol use. Certain foods and drinks, such as coffee, chocolate, onions, and peppermint. What increases the risk? Being overweight. Having a disease that affects your connective tissue. Taking NSAIDs, such a ibuprofen. What are the signs or symptoms? Heartburn. Difficult or painful swallowing. The feeling of having a lump in the throat. A bitter taste in the mouth. Bad breath. Having a lot of saliva. Having an upset or bloated stomach. Burping. Chest pain. Different conditions can cause chest pain. Make sure you see your doctor if you have chest pain. Shortness of breath or wheezing. A long-term cough or a cough at night. Wearing away of the surface of teeth (tooth enamel). Weight loss. How is this treated? Making changes to your diet. Taking medicine. Having surgery. Treatment will depend on how bad your symptoms are. Follow these instructions at home: Eating and drinking  Follow a  diet as told by your doctor. You may need to avoid foods and drinks such as: Coffee and tea, with or without caffeine. Drinks that contain alcohol. Energy drinks and sports drinks. Bubbly (carbonated) drinks or sodas. Chocolate and cocoa. Peppermint and mint flavorings. Garlic and onions. Horseradish. Spicy and acidic foods. These include peppers, chili powder, curry powder, vinegar, hot sauces, and BBQ sauce. Citrus fruit juices and citrus fruits, such as oranges, lemons, and limes. Tomato-based foods. These include red sauce, chili, salsa, and pizza with red sauce. Fried and fatty foods. These include donuts, french fries, potato chips, and high-fat dressings. High-fat meats. These include hot dogs, rib eye steak, sausage, ham, and bacon. High-fat dairy items, such as whole milk, butter, and cream cheese. Eat small meals often. Avoid eating large meals. Avoid drinking large amounts of liquid with your meals. Avoid eating meals during the 2-3 hours before bedtime. Avoid lying down right after you eat. Do not exercise right after you eat. Lifestyle  Do not smoke or use any products that contain nicotine or tobacco. If you need help quitting, ask your doctor. Try to lower your stress. If you need help doing this, ask your doctor. If you are overweight, lose an amount of weight that is healthy for you. Ask your doctor about a safe weight loss goal. General instructions Pay attention to any changes in your symptoms. Take over-the-counter and prescription medicines only as told by your doctor. Do not take aspirin, ibuprofen, or other NSAIDs unless your doctor says it is okay. Wear loose clothes. Do not wear anything tight around your waist. Raise (elevate) the head of your bed about   6 inches (15 cm). You may need to use a wedge to do this. Avoid bending over if this makes your symptoms worse. Keep all follow-up visits. Contact a doctor if: You have new symptoms. You lose weight and you  do not know why. You have trouble swallowing or it hurts to swallow. You have wheezing or a cough that keeps happening. You have a hoarse voice. Your symptoms do not get better with treatment. Get help right away if: You have sudden pain in your arms, neck, jaw, teeth, or back. You suddenly feel sweaty, dizzy, or light-headed. You have chest pain or shortness of breath. You vomit and the vomit is green, yellow, or black, or it looks like blood or coffee grounds. You faint. Your poop (stool) is red, bloody, or black. You cannot swallow, drink, or eat. These symptoms may represent a serious problem that is an emergency. Do not wait to see if the symptoms will go away. Get medical help right away. Call your local emergency services (911 in the [place] Do not drive yourself to the hospital. Summary If a person has gastroesophageal reflux disease (GERD), food and stomach acid move back up into the esophagus and cause symptoms or problems such as damage to the esophagus. Treatment will depend on how bad your symptoms are. Follow a diet as told by your doctor. Take all medicines only as told by your doctor. This information is not intended to replace advice given to you by your health care provider. Make sure you discuss any questions you have with your health care provider. Document Revised: 01/01/2020 Document Reviewed: 01/01/2020 Elsevier Patient Education  2023 [employer]  

## 2021-12-31 DIAGNOSIS — N921 Excessive and frequent menstruation with irregular cycle: Secondary | ICD-10-CM | POA: Insufficient documentation

## 2022-01-15 ENCOUNTER — Ambulatory Visit: Payer: No Typology Code available for payment source | Admitting: General Practice

## 2022-01-15 ENCOUNTER — Encounter: Payer: Self-pay | Admitting: Gastroenterology

## 2022-01-15 ENCOUNTER — Ambulatory Visit
Admission: RE | Admit: 2022-01-15 | Discharge: 2022-01-15 | Disposition: A | Discharge status: Home / Self Care | Payer: No Typology Code available for payment source | Attending: Gastroenterology | Admitting: Gastroenterology

## 2022-01-15 ENCOUNTER — Encounter
Admission: RE | Disposition: A | Discharge status: Home / Self Care | Payer: Self-pay | Source: Home / Self Care | Attending: Gastroenterology

## 2022-01-15 DIAGNOSIS — K3189 Other diseases of stomach and duodenum: Secondary | ICD-10-CM | POA: Insufficient documentation

## 2022-01-15 DIAGNOSIS — K2960 Other gastritis without bleeding: Secondary | ICD-10-CM | POA: Insufficient documentation

## 2022-01-15 DIAGNOSIS — R0789 Other chest pain: Secondary | ICD-10-CM

## 2022-01-15 HISTORY — PX: ESOPHAGOGASTRODUODENOSCOPY (EGD) WITH PROPOFOL: SHX5813

## 2022-01-15 LAB — POCT PREGNANCY, URINE: Preg Test, Ur: NEGATIVE

## 2022-01-15 SURGERY — ESOPHAGOGASTRODUODENOSCOPY (EGD) WITH PROPOFOL
Anesthesia: General

## 2022-01-15 MED ORDER — LIDOCAINE HCL (CARDIAC) PF 100 MG/5ML IV SOSY
PREFILLED_SYRINGE | INTRAVENOUS | Status: DC | PRN
  Administered 2022-01-15: 100 mg via INTRAVENOUS

## 2022-01-15 MED ORDER — SODIUM CHLORIDE 0.9 % IV SOLN
INTRAVENOUS | Status: DC
  Administered 2022-01-15: 1000 mL via INTRAVENOUS

## 2022-01-15 MED ORDER — PROPOFOL 10 MG/ML IV BOLUS
INTRAVENOUS | Status: DC | PRN
  Administered 2022-01-15: 40 mg via INTRAVENOUS
  Administered 2022-01-15: 100 mg via INTRAVENOUS
  Administered 2022-01-15: 40 mg via INTRAVENOUS

## 2022-01-15 MED ORDER — DEXMEDETOMIDINE HCL IN NACL 200 MCG/50ML IV SOLN
INTRAVENOUS | Status: DC | PRN
  Administered 2022-01-15: 8 ug via INTRAVENOUS

## 2022-01-15 MED ORDER — GLYCOPYRROLATE 0.2 MG/ML IJ SOLN
INTRAMUSCULAR | Status: DC | PRN
  Administered 2022-01-15: .2 mg via INTRAVENOUS

## 2022-01-15 MED ORDER — PROPOFOL 500 MG/50ML IV EMUL
INTRAVENOUS | Status: DC | PRN
  Administered 2022-01-15: 165 ug/kg/min via INTRAVENOUS

## 2022-01-15 NOTE — Anesthesia Procedure Notes (Signed)
Procedure Name: General with mask airway Date/Time: 01/15/2022 9:51 AM  Performed by: Mohammed Kindle, CRNAPre-anesthesia Checklist: Patient identified, Suction available, Patient being monitored, Emergency Drugs available and Timeout performed Patient Re-evaluated:Patient Re-evaluated prior to induction Oxygen Delivery Method: Simple face mask Induction Type: IV induction Placement Confirmation: CO2 detector, positive ETCO2 and breath sounds checked- equal and bilateral Dental Injury: Teeth and Oropharynx as per pre-operative assessment

## 2022-01-15 NOTE — Op Note (Signed)
[facility] Gastroenterology Patient Name: Kelly [name] Procedure Date: 01/15/2022 9:22 AM MRN: [id number] Account #: [id number] Date of Birth: [birth year] Admit Type: Outpatient Age: 41 Room: [facility] ENDO ROOM 4 Gender: Female Note Status: Finalized Instrument Name: Upper Endoscope [id number] Procedure:             Upper GI endoscopy Indications:           Chest pain (non cardiac) Providers:             Toney Reil MD, MD Referring MD:          Toney Reil MD, MD (Referring MD), Marisue Ivan (Referring MD) Medicines:             General Anesthesia Complications:         No immediate complications. Estimated blood loss: None. Procedure:             Pre-Anesthesia Assessment:                        - Prior to the procedure, a History and Physical was                         performed, and patient medications and allergies were                         reviewed. The patient is competent. The risks and                         benefits of the procedure and the sedation options and                         risks were discussed with the patient. All questions                         were answered and informed consent was obtained.                         Patient identification and proposed procedure were                         verified by the physician, the nurse, the                         anesthesiologist, the anesthetist and the technician                         in the pre-procedure area in the procedure room in the                         endoscopy suite. Mental Status Examination: alert and                         oriented. Airway Examination: normal oropharyngeal                         airway and neck mobility. Respiratory Examination:  clear to auscultation. CV Examination: normal.                         Prophylactic Antibiotics: The patient does not require                         prophylactic  antibiotics. Prior Anticoagulants: The                         patient has taken no previous anticoagulant or                         antiplatelet agents. ASA Grade Assessment: III - A                         patient with severe systemic disease. After reviewing                         the risks and benefits, the patient was deemed in                         satisfactory condition to undergo the procedure. The                         anesthesia plan was to use general anesthesia.                         Immediately prior to administration of medications,                         the patient was re-assessed for adequacy to receive                         sedatives. The heart rate, respiratory rate, oxygen                         saturations, blood pressure, adequacy of pulmonary                         ventilation, and response to care were monitored                         throughout the procedure. The physical status of the                         patient was re-assessed after the procedure.                        After obtaining informed consent, the endoscope was                         passed under direct vision. Throughout the procedure,                         the patient's blood pressure, pulse, and oxygen                         saturations were monitored continuously. The Endoscope  was introduced through the mouth, and advanced to the                         second part of duodenum. The upper GI endoscopy was                         accomplished without difficulty. The patient tolerated                         the procedure well. Findings:      The duodenal bulb and second portion of the duodenum were normal.      The entire examined stomach was normal. Biopsies were taken with a cold       forceps for histology.      The gastroesophageal junction and examined esophagus were normal.       Biopsies were taken with a cold forceps for histology.      The  cardia and gastric fundus were normal on retroflexion. Impression:            - Normal duodenal bulb and second portion of the                         duodenum.                        - Normal stomach. Biopsied.                        - Normal gastroesophageal junction and esophagus.                         Biopsied. Recommendation:        - Await pathology results.                        - Discharge patient to home (with escort).                        - Resume previous diet today.                        - Continue present medications. Procedure Code(s):     --- Professional ---                        [id number], Esophagogastroduodenoscopy, flexible,                         transoral; with biopsy, single or multiple Diagnosis Code(s):     --- Professional ---                        R07.89, Other chest pain CPT copyright 2019 American Medical Association. All rights reserved. The codes documented in this report are preliminary and upon coder review may  be revised to meet current compliance requirements. Dr. Libby Maw Toney Reil MD, MD 01/15/2022 9:38:29 AM This report has been signed electronically. Number of Addenda: 0 Note Initiated On: 01/15/2022 9:22 AM Estimated Blood Loss:  Estimated blood loss: none.      [facility]

## 2022-01-15 NOTE — H&P (Signed)
Arlyss Repress, MD [address]  [address]  [place], [place] [address]  Main: [phone]  Fax: [phone] Pager: [phone]  Primary Care Physician:  Marisue Ivan, MD Primary Gastroenterologist:  Dr. Arlyss Repress  Pre-Procedure History & Physical: HPI:  Kelly [name] is a 41 y.o. female is here for an endoscopy.   Past Medical History:  Diagnosis Date   Anxiety    Atrial fibrillation (HCC)    Depression    Hypertension    PONV (postoperative nausea and vomiting)     Past Surgical History:  Procedure Laterality Date   CESAREAN SECTION N/A    X2   CHOLECYSTECTOMY N/A 01/11/2015   Procedure: LAPAROSCOPIC CHOLECYSTECTOMY WITH INTRAOPERATIVE CHOLANGIOGRAM;  Surgeon: Tiney Rouge III, MD;  Location: [facility] ORS;  Service: General;  Laterality: N/A;    Prior to Admission medications   Medication Sig Start Date End Date Taking? Authorizing Provider  aspirin EC 81 MG EC tablet Take 1 tablet (81 mg total) by mouth daily. Swallow whole. 08/27/20  Yes Wieting, Richard, MD  cyanocobalamin (,VITAMIN B-12,) 1000 MCG/ML injection Inject into the muscle. 12/08/21 03/27/22 Yes [provider]  diltiazem (CARDIZEM CD) 180 MG 24 hr capsule Take 180 mg by mouth daily. 10/28/21  Yes [provider]  levonorgestrel-ethinyl estradiol (SEASONALE) 0.15-0.03 MG tablet Take 1 tablet by mouth daily. 09/08/21  Yes [provider]  loratadine (CLARITIN) 10 MG tablet Take 10 mg by mouth at bedtime.   Yes [provider]  omeprazole (PRILOSEC) 40 MG capsule Take 1 capsule (40 mg total) by mouth in the morning and at bedtime. 12/22/21  Yes Amel Kitch, Loel Dubonnet, MD  nitroGLYCERIN (NITROSTAT) 0.4 MG SL tablet Place 1 tablet (0.4 mg total) under the tongue every 5 (five) minutes as needed for chest pain. 09/10/21 12/22/21  Furth, Cadence H, PA-C    Allergies as of 12/22/2021   (No Known Allergies)    Family History  Problem Relation Age of Onset   Diabetes Mother     Hypertension Mother    Hyperlipidemia Mother    Heart disease Father    Breast cancer Neg Hx     Social History   Socioeconomic History   Marital status: Married    Spouse name: Not on file   Number of children: Not on file   Years of education: Not on file   Highest education level: Not on file  Occupational History   Not on file  Tobacco Use   Smoking status: Never   Smokeless tobacco: Never  Vaping Use   Vaping Use: Never used  Substance and Sexual Activity   Alcohol use: No   Drug use: No   Sexual activity: Yes  Other Topics Concern   Not on file  Social History Narrative   Not on file   Social Determinants of Health   Financial Resource Strain: Not on file  Food Insecurity: Not on file  Transportation Needs: Not on file  Physical Activity: Not on file  Stress: Not on file  Social Connections: Not on file  Intimate Partner Violence: Not on file    Review of Systems: See HPI, otherwise negative ROS  Physical Exam: BP (!) 153/90   Pulse 95   Temp 97.6 F (36.4 C) (Temporal)   Resp 18   Ht 5\' 10"  (1.778 m)   Wt 100 kg   SpO2 100%   BMI 31.63 kg/m  General:   Alert,  pleasant and cooperative in NAD Head:  Normocephalic and atraumatic. Neck:  Supple; no masses or thyromegaly. Lungs:  Clear throughout to auscultation.    Heart:  Regular rate and rhythm. Abdomen:  Soft, nontender and nondistended. Normal bowel sounds, without guarding, and without rebound.   Neurologic:  Alert and  oriented x4;  grossly normal neurologically.  Impression/Plan: Kelly [name] is here for an endoscopy to be performed for Atypical chest pain, chest pressure  Risks, benefits, limitations, and alternatives regarding  endoscopy have been reviewed with the patient.  Questions have been answered.  All parties agreeable.   Lannette Donath, MD  01/15/2022, 9:17 AM

## 2022-01-15 NOTE — Anesthesia Preprocedure Evaluation (Signed)
Anesthesia Evaluation  Patient identified by MRN, date of birth, ID band Patient awake    Reviewed: Allergy & Precautions, NPO status , Patient's Chart, lab work & pertinent test results  History of Anesthesia Complications (+) PONV and history of anesthetic complications  Airway Mallampati: III  TM Distance: >3 FB Neck ROM: full    Dental  (+) Teeth Intact   Pulmonary neg pulmonary ROS,    Pulmonary exam normal        Cardiovascular hypertension, negative cardio ROS Normal cardiovascular examAtrial Fibrillation      Neuro/Psych PSYCHIATRIC DISORDERS Anxiety negative neurological ROS     GI/Hepatic negative GI ROS, Neg liver ROS,   Endo/Other  negative endocrine ROS  Renal/GU negative Renal ROS  negative genitourinary   Musculoskeletal   Abdominal   Peds  Hematology negative hematology ROS (+)   Anesthesia Other Findings Past Medical History: No date: Anxiety No date: Atrial fibrillation (HCC) No date: Depression No date: Hypertension No date: PONV (postoperative nausea and vomiting)  Past Surgical History: No date: CESAREAN SECTION; N/A     Comment:  X2 01/11/2015: CHOLECYSTECTOMY; N/A     Comment:  Procedure: LAPAROSCOPIC CHOLECYSTECTOMY WITH               INTRAOPERATIVE CHOLANGIOGRAM;  Surgeon: Tiney Rouge III,               MD;  Location: [facility] ORS;  Service: General;  Laterality:               N/A;  BMI    Body Mass Index: 31.63 kg/m      Reproductive/Obstetrics negative OB ROS                             Anesthesia Physical Anesthesia Plan  ASA: 3  Anesthesia Plan: General   Post-op Pain Management:    Induction: Intravenous  PONV Risk Score and Plan: Propofol infusion and TIVA  Airway Management Planned:   Additional Equipment:   Intra-op Plan:   Post-operative Plan:   Informed Consent: I have reviewed the patients History and Physical, chart, labs and  discussed the procedure including the risks, benefits and alternatives for the proposed anesthesia with the patient or authorized representative who has indicated his/her understanding and acceptance.     Dental Advisory Given  Plan Discussed with: Anesthesiologist, CRNA and Surgeon  Anesthesia Plan Comments:         Anesthesia Quick Evaluation

## 2022-01-15 NOTE — Anesthesia Postprocedure Evaluation (Signed)
Anesthesia Post Note  Patient: Marchel Foote [facility]  Procedure(s) Performed: ESOPHAGOGASTRODUODENOSCOPY (EGD) WITH PROPOFOL  Patient location during evaluation: Endoscopy Anesthesia Type: General Level of consciousness: awake and alert Pain management: pain level controlled Vital Signs Assessment: post-procedure vital signs reviewed and stable Respiratory status: spontaneous breathing, nonlabored ventilation, respiratory function stable and patient connected to nasal cannula oxygen Cardiovascular status: blood pressure returned to baseline and stable Postop Assessment: no apparent nausea or vomiting Anesthetic complications: no   No notable events documented.   Last Vitals:  Vitals:   01/15/22 0857 01/15/22 0941  BP: (!) 153/90 121/65  Pulse: 95   Resp: 18   Temp: 36.4 C (!) 36.1 C  SpO2: 100%     Last Pain:  Vitals:   01/15/22 1001  TempSrc:   PainSc: 0-No pain                 Stephanie Coup

## 2022-01-15 NOTE — Transfer of Care (Signed)
Immediate Anesthesia Transfer of Care Note  Patient: Kelly [name] [facility]  Procedure(s) Performed: ESOPHAGOGASTRODUODENOSCOPY (EGD) WITH PROPOFOL  Patient Location: Endoscopy Unit  Anesthesia Type:General  Level of Consciousness: awake, drowsy and patient cooperative  Airway & Oxygen Therapy: Patient Spontanous Breathing  Post-op Assessment: Report given to RN and Post -op Vital signs reviewed and stable  Post vital signs: Reviewed and stable  Last Vitals:  Vitals Value Taken Time  BP 121/65 01/15/22 0941  Temp 36.1 C 01/15/22 0941  Pulse 71 01/15/22 0951  Resp 16 01/15/22 0951  SpO2 98 % 01/15/22 0951  Vitals shown include unvalidated device data.  Last Pain:  Vitals:   01/15/22 0941  TempSrc: Temporal         Complications: No notable events documented.

## 2022-01-16 ENCOUNTER — Encounter: Payer: Self-pay | Admitting: Gastroenterology

## 2022-01-16 LAB — SURGICAL PATHOLOGY

## 2022-01-26 ENCOUNTER — Other Ambulatory Visit: Payer: Self-pay

## 2022-01-26 MED ORDER — DILTIAZEM HCL ER COATED BEADS 180 MG PO CP24: 180.0000 mg | ORAL_CAPSULE | Freq: Every day | ORAL | 3 refills | Status: DC

## 2022-01-26 NOTE — Progress Notes (Signed)
See MyChart request for pharmacy change.

## 2022-03-12 ENCOUNTER — Other Ambulatory Visit: Payer: Self-pay | Admitting: Obstetrics and Gynecology

## 2022-03-12 DIAGNOSIS — Z1231 Encounter for screening mammogram for malignant neoplasm of breast: Secondary | ICD-10-CM

## 2022-03-14 ENCOUNTER — Other Ambulatory Visit: Payer: Self-pay | Admitting: Gastroenterology

## 2022-03-14 DIAGNOSIS — R0789 Other chest pain: Secondary | ICD-10-CM

## 2022-04-13 DIAGNOSIS — E781 Pure hyperglyceridemia: Secondary | ICD-10-CM | POA: Insufficient documentation

## 2022-04-15 ENCOUNTER — Ambulatory Visit
Admission: RE | Admit: 2022-04-15 | Discharge: 2022-04-15 | Disposition: A | Discharge status: Home / Self Care | Payer: No Typology Code available for payment source | Source: Ambulatory Visit | Attending: Obstetrics and Gynecology | Admitting: Obstetrics and Gynecology

## 2022-04-15 DIAGNOSIS — Z1231 Encounter for screening mammogram for malignant neoplasm of breast: Secondary | ICD-10-CM | POA: Diagnosis present

## 2022-04-16 ENCOUNTER — Other Ambulatory Visit: Payer: Self-pay

## 2022-04-22 ENCOUNTER — Ambulatory Visit: Payer: No Typology Code available for payment source | Admitting: Gastroenterology

## 2022-04-22 ENCOUNTER — Encounter: Payer: Self-pay | Admitting: Gastroenterology

## 2022-04-22 VITALS — BP 129/80 | HR 69 | Temp 99.0°F | Ht 70.0 in | Wt 225.1 lb

## 2022-04-22 DIAGNOSIS — R0789 Other chest pain: Secondary | ICD-10-CM | POA: Diagnosis not present

## 2022-04-22 MED ORDER — AMITRIPTYLINE HCL 25 MG PO TABS: 25.0000 mg | ORAL_TABLET | Freq: Every day | ORAL | 0 refills | Status: DC

## 2022-04-22 NOTE — Progress Notes (Signed)
Arlyss Repress, MD [address]  [address]  [place], [place] [address]  Main: [phone]  Fax: [phone]    Gastroenterology Consultation  Referring Provider:     Marisue Ivan, MD Primary Care Physician:  Marisue Ivan, MD Primary Gastroenterologist:  Dr. Arlyss Repress Reason for Consultation: Atypical chest pain, chest pressure        HPI:   Kelly [name] is a 41 y.o. female referred by Dr. Marisue Ivan, MD  for consultation & management of atypical chest pain, chest pressure.  Patient reports that since beginning of this year, she has been experiencing chest pain, pressure.  She underwent cardiac work-up which was unrevealing.  She had paroxysmal A-fib, currently.  She underwent CT angio PE protocol which was negative.  She was found to have small hiatal hernia.  She also underwent CT coronary angiogram which was normal.  Patient is therefore referred to GI for further evaluation of atypical chest pain.  Patient was taking Protonix 20 mg daily and she also received sucralfate for a short course and noticed mild improvement in her symptoms.  Patient reports that she has been going through a lot of stress lately taking care of her mom who has moderate dementia and lives with her.  Her husband is a Emergency planning/management officer.  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 Patient reports that her bowel movements have been kind of irregular lately.  She denies any abdominal pain, nausea or vomiting.  She is status postcholecystectomy secondary to acute calculus cholecystitis in 2016 Labs including CBC, CMP are unremarkable  Follow-up visit 04/22/2022 Patient underwent upper endoscopy which was unremarkable, esophageal biopsies revealed reflux esophagitis only.  Patient has been on omeprazole 40 mg twice daily since June.  She continues to have intermittent episodes of chest pain/spasm which are sporadic, sometimes with 2 hours after a meal, sometimes end  of the day.  She also reports anxiety probably playing a role leading to chest pain  Patient does not smoke or drink alcohol She does not smoke marijuana or vape   NSAIDs: None  Antiplts/Anticoagulants/Anti thrombotics: None  GI Procedures: Reports undergoing upper endoscopy and colonoscopy at age 6 Upper endoscopy 01/15/2022 - Normal duodenal bulb and second portion of the duodenum. - Normal stomach. Biopsied. - Normal gastroesophageal junction and esophagus. Biopsied.  DIAGNOSIS:  A. STOMACH, RANDOM; COLD BIOPSY:  - ANTRAL MUCOSA WITH REACTIVE GASTRITIS.  - NEGATIVE FOR H. PYLORI, INTESTINAL METAPLASIA, DYSPLASIA, AND  MALIGNANCY.   B.  ESOPHAGUS, RANDOM; COLD BIOPSY:  - SQUAMOUS MUCOSA WITH FOCAL CHANGES COMPATIBLE WITH REFLUX.  - NEGATIVE FOR INTRAEPITHELIAL EOSINOPHILS, DYSPLASIA, AND MALIGNANCY.    Past Medical History:  Diagnosis Date   Anxiety    Atrial fibrillation (HCC)    Depression    Hypertension    PONV (postoperative nausea and vomiting)     Past Surgical History:  Procedure Laterality Date   CESAREAN SECTION N/A    X2   CHOLECYSTECTOMY N/A 01/11/2015   Procedure: LAPAROSCOPIC CHOLECYSTECTOMY WITH INTRAOPERATIVE CHOLANGIOGRAM;  Surgeon: Tiney Rouge III, MD;  Location: [facility];  Service: General;  Laterality: N/A;   ESOPHAGOGASTRODUODENOSCOPY (EGD) WITH PROPOFOL N/A 01/15/2022   Procedure: ESOPHAGOGASTRODUODENOSCOPY (EGD) WITH PROPOFOL;  Surgeon: Toney Reil, MD;  Location: [facility];  Service: Gastroenterology;  Laterality: N/A;     Current Outpatient Medications:    ALAYCEN 1/35 tablet, Take by mouth., Disp: , Rfl:    amitriptyline (ELAVIL) 25 MG tablet, Take  1 tablet (25 mg total) by mouth at bedtime., Disp: 30 tablet, Rfl: 0   aspirin EC 81 MG EC tablet, Take 1 tablet (81 mg total) by mouth daily. Swallow whole., Disp: 30 tablet, Rfl: 0   cyanocobalamin (VITAMIN B12) 1000 MCG/ML injection, Inject into the muscle., Disp: , Rfl:     diltiazem (CARDIZEM CD) 180 MG 24 hr capsule, Take 1 capsule (180 mg total) by mouth daily., Disp: 30 capsule, Rfl: 3   loratadine (CLARITIN) 10 MG tablet, Take 10 mg by mouth at bedtime., Disp: , Rfl:    omeprazole (PRILOSEC) 40 MG capsule, TAKE 1 CAPSULE (40 MG TOTAL) BY MOUTH IN THE MORNING AND ONE CAPSULE AT BEDTIME., Disp: 180 capsule, Rfl: 3   nitroGLYCERIN (NITROSTAT) 0.4 MG SL tablet, Place 1 tablet (0.4 mg total) under the tongue every 5 (five) minutes as needed for chest pain., Disp: 10 tablet, Rfl: 0   Family History  Problem Relation Age of Onset   Diabetes Mother    Hypertension Mother    Hyperlipidemia Mother    Heart disease Father    Breast cancer Neg Hx      Social History   Tobacco Use   Smoking status: Never   Smokeless tobacco: Never  Vaping Use   Vaping Use: Never used  Substance Use Topics   Alcohol use: No   Drug use: No    Allergies as of 04/22/2022   (No Known Allergies)    Review of Systems:    All systems reviewed and negative except where noted in HPI.   Physical Exam:  BP 129/80 (BP Location: Left Arm, Patient Position: Sitting, Cuff Size: Normal)   Pulse 69   Temp 99 F (37.2 C) (Oral)   Ht 5\' 10"  (1.778 m)   Wt 225 lb 2 oz (102.1 kg)   LMP 04/15/2022   BMI 32.30 kg/m  Patient's last menstrual period was 04/15/2022.  General:   Alert,  Well-developed, well-nourished, pleasant and cooperative in NAD Head:  Normocephalic and atraumatic. Eyes:  Sclera clear, no icterus.   Conjunctiva pink. Ears:  Normal auditory acuity. Nose:  No deformity, discharge, or lesions. Mouth:  No deformity or lesions,oropharynx pink & moist. Neck:  Supple; no masses or thyromegaly. Lungs:  Respirations even and unlabored.  Clear throughout to auscultation.   No wheezes, crackles, or rhonchi. No acute distress. Heart:  Regular rate and rhythm; no murmurs, clicks, rubs, or gallops. Abdomen:  Normal bowel sounds. Soft, non-tender and non-distended without  masses, hepatosplenomegaly or hernias noted.  No guarding or rebound tenderness.   Rectal: Not performed Msk:  Symmetrical without gross deformities. Good, equal movement & strength bilaterally. Pulses:  Normal pulses noted. Extremities:  No clubbing or edema.  No cyanosis. Neurologic:  Alert and oriented x3;  grossly normal neurologically. Skin:  Intact without significant lesions or rashes. No jaundice. Psych:  Alert and cooperative. Normal mood and affect.  Imaging Studies: Reviewed  Assessment and Plan:   Kelly [name] is a 41 y.o. pleasant Caucasian female with history of anxiety, depression, A-fib is seen in consultation for atypical chest pain/pressure.  Cardiac work-up has been unremarkable.  CT angio PE protocol negative.  Atypical chest pain/pressure: Likely functional EGD with esophageal biopsies and gastric biopsies were unremarkable other than reflux changes Patient has been on omeprazole 40 mg p.o. twice daily before meals since June 2023 Therefore, recommend to decrease omeprazole to once a day, take Pepcid as needed Trial of low-dose amitriptyline at bedtime Discussed  about healthy diet again  Vitamin B12 deficiency gastric biopsies did not reveal atrophic gastritis  Follow-up i as needed   Arlyss Repress, MD

## 2022-04-30 ENCOUNTER — Encounter: Payer: Self-pay | Admitting: Emergency Medicine

## 2022-04-30 ENCOUNTER — Other Ambulatory Visit: Payer: Self-pay

## 2022-04-30 ENCOUNTER — Emergency Department: Payer: No Typology Code available for payment source

## 2022-04-30 ENCOUNTER — Observation Stay
Admission: EM | Admit: 2022-04-30 | Discharge: 2022-05-01 | Disposition: A | Discharge status: Home / Self Care | Payer: No Typology Code available for payment source | Attending: Family Medicine | Admitting: Family Medicine

## 2022-04-30 DIAGNOSIS — E282 Polycystic ovarian syndrome: Secondary | ICD-10-CM | POA: Insufficient documentation

## 2022-04-30 DIAGNOSIS — I4891 Unspecified atrial fibrillation: Secondary | ICD-10-CM | POA: Diagnosis present

## 2022-04-30 DIAGNOSIS — Z79899 Other long term (current) drug therapy: Secondary | ICD-10-CM | POA: Insufficient documentation

## 2022-04-30 DIAGNOSIS — F411 Generalized anxiety disorder: Secondary | ICD-10-CM | POA: Diagnosis present

## 2022-04-30 DIAGNOSIS — I10 Essential (primary) hypertension: Secondary | ICD-10-CM | POA: Diagnosis not present

## 2022-04-30 DIAGNOSIS — R0602 Shortness of breath: Secondary | ICD-10-CM | POA: Diagnosis not present

## 2022-04-30 DIAGNOSIS — K2100 Gastro-esophageal reflux disease with esophagitis, without bleeding: Secondary | ICD-10-CM | POA: Diagnosis present

## 2022-04-30 DIAGNOSIS — I480 Paroxysmal atrial fibrillation: Principal | ICD-10-CM | POA: Insufficient documentation

## 2022-04-30 LAB — BASIC METABOLIC PANEL
Anion gap: 12 (ref 5–15)
BUN: 10 mg/dL (ref 6–20)
CO2: 22 mmol/L (ref 22–32)
Calcium: 9.4 mg/dL (ref 8.9–10.3)
Chloride: 107 mmol/L (ref 98–111)
Creatinine, Ser: 0.73 mg/dL (ref 0.44–1.00)
GFR, Estimated: >60 mL/min (ref >60)
Glucose, Bld: 111 mg/dL — ABNORMAL HIGH (ref 70–99)
Potassium: 3 mmol/L — ABNORMAL LOW (ref 3.5–5.1)
Sodium: 141 mmol/L (ref 135–145)

## 2022-04-30 LAB — CBC
HCT: 48.8 % — ABNORMAL HIGH (ref 36.0–46.0)
Hemoglobin: 16.7 g/dL — ABNORMAL HIGH (ref 12.0–15.0)
MCH: 30.3 pg (ref 26.0–34.0)
MCHC: 34.2 g/dL (ref 30.0–36.0)
MCV: 88.6 fL (ref 80.0–100.0)
Platelets: 302 10*3/uL (ref 150–400)
RBC: 5.51 MIL/uL — ABNORMAL HIGH (ref 3.87–5.11)
RDW: 12.4 % (ref 11.5–15.5)
WBC: 10.1 10*3/uL (ref 4.0–10.5)
nRBC: 0 % (ref 0.0–0.2)

## 2022-04-30 LAB — TROPONIN I (HIGH SENSITIVITY)
Troponin I (High Sensitivity): 6 ng/L (ref <18)
Troponin I (High Sensitivity): 15 ng/L (ref <18)

## 2022-04-30 LAB — TSH: TSH: 2.37 u[IU]/mL (ref 0.350–4.500)

## 2022-04-30 LAB — MAGNESIUM: Magnesium: 2.3 mg/dL (ref 1.7–2.4)

## 2022-04-30 MED ORDER — ENOXAPARIN SODIUM 60 MG/0.6ML IJ SOSY: 0.5000 mg/kg | PREFILLED_SYRINGE | INTRAMUSCULAR | Status: DC

## 2022-04-30 MED ORDER — SODIUM CHLORIDE 0.9% FLUSH: 3.0000 mL | INTRAVENOUS | Status: DC | PRN

## 2022-04-30 MED ORDER — AMITRIPTYLINE HCL 25 MG PO TABS
25.0000 mg | ORAL_TABLET | Freq: Every day | ORAL | Status: DC
  Administered 2022-04-30: 25 mg via ORAL
  Filled 2022-04-30: qty 1

## 2022-04-30 MED ORDER — MIDAZOLAM HCL (PF) 10 MG/2ML IJ SOLN
INTRAMUSCULAR | Status: AC
  Filled 2022-04-30: qty 2

## 2022-04-30 MED ORDER — ASPIRIN 81 MG PO TBEC
81.0000 mg | DELAYED_RELEASE_TABLET | Freq: Every day | ORAL | Status: DC
  Filled 2022-04-30: qty 1

## 2022-04-30 MED ORDER — MIDAZOLAM HCL 2 MG/2ML IJ SOLN
2.0000 mg | Freq: Once | INTRAMUSCULAR | Status: AC
  Administered 2022-04-30: 2 mg via INTRAVENOUS

## 2022-04-30 MED ORDER — CARVEDILOL 6.25 MG PO TABS
3.1250 mg | ORAL_TABLET | Freq: Two times a day (BID) | ORAL | Status: DC
  Administered 2022-04-30: 3.125 mg via ORAL
  Filled 2022-04-30: qty 1

## 2022-04-30 MED ORDER — DILTIAZEM HCL ER COATED BEADS 180 MG PO CP24: 180.0000 mg | ORAL_CAPSULE | Freq: Every day | ORAL | Status: DC

## 2022-04-30 MED ORDER — SUCRALFATE 1 GM/10ML PO SUSP
1.0000 g | Freq: Three times a day (TID) | ORAL | Status: DC
  Filled 2022-04-30 (×3): qty 10

## 2022-04-30 MED ORDER — SODIUM CHLORIDE 0.9% FLUSH
3.0000 mL | Freq: Two times a day (BID) | INTRAVENOUS | Status: DC
  Administered 2022-04-30: 3 mL via INTRAVENOUS

## 2022-04-30 MED ORDER — SODIUM CHLORIDE 0.9 % IV BOLUS
1000.0000 mL | Freq: Once | INTRAVENOUS | Status: AC
  Administered 2022-04-30: 1000 mL via INTRAVENOUS

## 2022-04-30 MED ORDER — APIXABAN 5 MG PO TABS
5.0000 mg | ORAL_TABLET | Freq: Two times a day (BID) | ORAL | Status: DC
  Administered 2022-04-30 – 2022-05-01 (×2): 5 mg via ORAL
  Filled 2022-04-30 (×3): qty 1

## 2022-04-30 MED ORDER — MIDAZOLAM HCL 2 MG/2ML IJ SOLN
INTRAMUSCULAR | Status: AC
  Filled 2022-04-30: qty 2

## 2022-04-30 MED ORDER — PANTOPRAZOLE SODIUM 40 MG PO TBEC
80.0000 mg | DELAYED_RELEASE_TABLET | Freq: Every day | ORAL | Status: DC
  Administered 2022-04-30 – 2022-05-01 (×2): 80 mg via ORAL
  Filled 2022-04-30 (×2): qty 2

## 2022-04-30 MED ORDER — METOPROLOL TARTRATE 5 MG/5ML IV SOLN: 2.5000 mg | Freq: Once | INTRAVENOUS | Status: DC

## 2022-04-30 MED ORDER — LORATADINE 10 MG PO TABS
10.0000 mg | ORAL_TABLET | Freq: Every day | ORAL | Status: DC
  Administered 2022-04-30: 10 mg via ORAL
  Filled 2022-04-30 (×3): qty 1

## 2022-04-30 MED ORDER — POTASSIUM CHLORIDE 10 MEQ/100ML IV SOLN
10.0000 meq | Freq: Once | INTRAVENOUS | Status: AC
  Administered 2022-04-30: 10 meq via INTRAVENOUS
  Filled 2022-04-30: qty 100

## 2022-04-30 MED ORDER — DILTIAZEM HCL 25 MG/5ML IV SOLN
INTRAVENOUS | Status: AC
  Filled 2022-04-30: qty 5

## 2022-04-30 MED ORDER — ACETAMINOPHEN 325 MG PO TABS
650.0000 mg | ORAL_TABLET | Freq: Four times a day (QID) | ORAL | Status: DC | PRN
  Administered 2022-05-01: 650 mg via ORAL
  Filled 2022-04-30: qty 2

## 2022-04-30 MED ORDER — ACETAMINOPHEN 650 MG RE SUPP: 650.0000 mg | Freq: Four times a day (QID) | RECTAL | Status: DC | PRN

## 2022-04-30 MED ORDER — DILTIAZEM HCL 25 MG/5ML IV SOLN
20.0000 mg | Freq: Once | INTRAVENOUS | Status: AC
  Administered 2022-04-30: 20 mg via INTRAVENOUS

## 2022-04-30 MED ORDER — POTASSIUM CHLORIDE 20 MEQ PO PACK
40.0000 meq | PACK | Freq: Every day | ORAL | Status: DC
  Administered 2022-04-30: 40 meq via ORAL
  Filled 2022-04-30: qty 2

## 2022-04-30 MED ORDER — DILTIAZEM HCL-DEXTROSE 125-5 MG/125ML-% IV SOLN (PREMIX)
5.0000 mg/h | INTRAVENOUS | Status: DC
  Administered 2022-04-30: 15 mg/h via INTRAVENOUS
  Filled 2022-04-30: qty 125

## 2022-04-30 MED ORDER — NORETHINDRONE-ETH ESTRADIOL 1-35 MG-MCG PO TABS: 1.0000 | ORAL_TABLET | Freq: Every day | ORAL | Status: DC

## 2022-04-30 MED ORDER — FENTANYL CITRATE PF 50 MCG/ML IJ SOSY
50.0000 ug | PREFILLED_SYRINGE | Freq: Once | INTRAMUSCULAR | Status: DC
  Filled 2022-04-30: qty 1

## 2022-04-30 MED ORDER — METOPROLOL TARTRATE 50 MG PO TABS
50.0000 mg | ORAL_TABLET | Freq: Two times a day (BID) | ORAL | Status: DC
  Administered 2022-04-30 – 2022-05-01 (×3): 50 mg via ORAL
  Filled 2022-04-30 (×3): qty 1

## 2022-04-30 MED ORDER — KETOROLAC TROMETHAMINE 15 MG/ML IJ SOLN: 15.0000 mg | Freq: Four times a day (QID) | INTRAMUSCULAR | Status: DC | PRN

## 2022-04-30 MED ORDER — LORAZEPAM 2 MG/ML IJ SOLN: 0.5000 mg | Freq: Once | INTRAMUSCULAR | Status: DC

## 2022-04-30 MED ORDER — SODIUM CHLORIDE 0.9 % IV SOLN: 250.0000 mL | INTRAVENOUS | Status: DC | PRN

## 2022-04-30 MED ORDER — DILTIAZEM HCL-DEXTROSE 125-5 MG/125ML-% IV SOLN (PREMIX)
INTRAVENOUS | Status: AC
  Administered 2022-04-30: 10 mg/h via INTRAVENOUS
  Filled 2022-04-30: qty 125

## 2022-04-30 MED ORDER — SENNA 8.6 MG PO TABS
1.0000 | ORAL_TABLET | Freq: Two times a day (BID) | ORAL | Status: DC
  Filled 2022-04-30 (×3): qty 1

## 2022-04-30 NOTE — H&P (Signed)
History and Physical    Kelly [name] GUY:[id number] DOB: 01-13-1981 DOA: 04/30/2022  DOS: the patient was seen and examined on 04/30/2022  PCP: Marisue Ivan, MD   Patient coming from: Home  I have personally briefly reviewed patient's old medical records in [facility]  Kelly [name], a 41 y/o married mother of two has a h/o PAF with RVR. Full cardiac w/u negative for CAD. She does follow with EP doctor at [facility]. Additional problems: GERD with esophagitis, HTN, GAD. Today she had onset of very rapid heart rate accompanied by weakness. She presents to [facility]ED for evaluation.    ED Course: afebrile  162/94  HR 220 down to 136 with diltiazem qtt. No indication for cardioversion - BP elevated, Chest pain - noncardiac. Lab: K 3.0 - had replacement in ED with 10 meq IV and oral K 40 meq started. CBC nl, TSH 2.37 (nl). CXR-NAD. EDP called Cardiology. TR[facility] called to admit for continued mgt PAF-RVR.  Review of Systems:  Review of Systems  Constitutional:  Negative for chills, fever and weight loss.  HENT: Negative.    Eyes: Negative.   Respiratory: Negative.    Cardiovascular:  Positive for chest pain and palpitations.       Chronic intermittent chest pressure associated with GERD  Gastrointestinal:        GERD symptoms  Genitourinary: Negative.   Musculoskeletal: Negative.   Skin: Negative.   Neurological: Negative.   Endo/Heme/Allergies: Negative.   Psychiatric/Behavioral:  Negative for depression. The patient is nervous/anxious.     Past Medical History:  Diagnosis Date   Anxiety    Atrial fibrillation (HCC)    Depression    Hypertension    PONV (postoperative nausea and vomiting)     Past Surgical History:  Procedure Laterality Date   CESAREAN SECTION N/A    X2   CHOLECYSTECTOMY N/A 01/11/2015   Procedure: LAPAROSCOPIC CHOLECYSTECTOMY WITH INTRAOPERATIVE CHOLANGIOGRAM;  Surgeon: Tiney Rouge III, MD;  Location: [facility];  Service: General;  Laterality: N/A;    ESOPHAGOGASTRODUODENOSCOPY (EGD) WITH PROPOFOL N/A 01/15/2022   Procedure: ESOPHAGOGASTRODUODENOSCOPY (EGD) WITH PROPOFOL;  Surgeon: Toney Reil, MD;  Location: [facility];  Service: Gastroenterology;  Laterality: N/A;    Soc Hx - married, mother of two. 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 Spouse is a Nurse, adult. She is primary caregiver for her mother who has dementia, lives in their home. Placement in memory care has been a challenge.   reports that she has never smoked. She has never used smokeless tobacco. She reports that she does not drink alcohol and does not use drugs.  No Known Allergies  Family History  Problem Relation Age of Onset   Diabetes Mother    Hypertension Mother    Hyperlipidemia Mother    Heart disease Father    Breast cancer Neg Hx     Prior to Admission medications   Medication Sig Start Date End Date Taking? Authorizing Provider  ALAYCEN 1/35 tablet Take by mouth. 03/11/22   [provider]  amitriptyline (ELAVIL) 25 MG tablet Take 1 tablet (25 mg total) by mouth at bedtime. 04/22/22 05/22/22  Toney Reil, MD  aspirin EC 81 MG EC tablet Take 1 tablet (81 mg total) by mouth daily. Swallow whole. 08/27/20   Alford Highland, MD  cyanocobalamin (VITAMIN B12) 1000 MCG/ML injection Inject into the muscle. 03/27/22   [provider]  diltiazem (CARDIZEM CD) 180 MG 24 hr capsule Take 1 capsule (180 mg  total) by mouth daily. 01/26/22   Furth, Cadence H, PA-C  loratadine (CLARITIN) 10 MG tablet Take 10 mg by mouth at bedtime.    [provider]  nitroGLYCERIN (NITROSTAT) 0.4 MG SL tablet Place 1 tablet (0.4 mg total) under the tongue every 5 (five) minutes as needed for chest pain. 09/10/21 12/22/21  Furth, Cadence H, PA-C  omeprazole (PRILOSEC) 40 MG capsule TAKE 1 CAPSULE (40 MG TOTAL) BY MOUTH IN THE MORNING AND ONE CAPSULE AT BEDTIME. 03/16/22   Lin Landsman, MD    Physical Exam: Vitals:   04/30/22  0732 04/30/22 0738 04/30/22 0745  BP:  (!) 170/95 (!) 162/94  Pulse:  (!) 197 (!) 113  Resp:  (!) 22 13  Temp:  98.7 F (37.1 C)   TempSrc:  Oral   SpO2:  100% 100%  Weight: 99.8 kg    Height: 5\' 10"  (1.778 m)      Physical Exam Vitals and nursing note reviewed.  Constitutional:      General: She is not in acute distress.    Appearance: She is well-developed. She is obese. She is not ill-appearing or toxic-appearing.  HENT:     Head: Normocephalic and atraumatic.  Eyes:     Extraocular Movements: Extraocular movements intact.     Pupils: Pupils are equal, round, and reactive to light.  Neck:     Thyroid: No thyromegaly.  Cardiovascular:     Rate and Rhythm: Tachycardia present. Rhythm irregular.     Pulses:          Carotid pulses are 2+ on the right side and 2+ on the left side.      Radial pulses are 2+ on the right side and 2+ on the left side.       Dorsalis pedis pulses are 2+ on the right side and 2+ on the left side.       Posterior tibial pulses are 2+ on the right side and 2+ on the left side.     Heart sounds: Normal heart sounds.  Pulmonary:     Effort: Pulmonary effort is normal. No tachypnea or accessory muscle usage.     Breath sounds: Normal breath sounds.  Chest:     Chest wall: No mass or tenderness.  Abdominal:     General: Bowel sounds are normal.     Palpations: Abdomen is soft.     Tenderness: There is no abdominal tenderness.  Musculoskeletal:     Cervical back: Normal range of motion and neck supple.     Right lower leg: No tenderness. No edema.     Left lower leg: No tenderness. No edema.  Lymphadenopathy:     Cervical: No cervical adenopathy.  Skin:    General: Skin is warm and dry.  Neurological:     General: No focal deficit present.     Mental Status: She is alert and oriented to person, place, and time.  Psychiatric:        Mood and Affect: Mood normal.        Behavior: Behavior normal.      Labs on Admission: I have personally  reviewed following labs and imaging studies  CBC: Recent Labs  Lab 04/30/22 0739  WBC 10.1  HGB 16.7*  HCT 48.8*  MCV 88.6  PLT 174   Basic Metabolic Panel: Recent Labs  Lab 04/30/22 0739  NA 141  K 3.0*  CL 107  CO2 22  GLUCOSE 111*  BUN 10  CREATININE  0.73  CALCIUM 9.4   GFR: Estimated Creatinine Clearance: 118.3 mL/min (by C-G formula based on SCr of 0.73 mg/dL). Liver Function Tests: No results for input(s): "AST", "ALT", "ALKPHOS", "BILITOT", "PROT", "ALBUMIN" in the last 168 hours. No results for input(s): "LIPASE", "AMYLASE" in the last 168 hours. No results for input(s): "AMMONIA" in the last 168 hours. Coagulation Profile: No results for input(s): "INR", "PROTIME" in the last 168 hours. Cardiac Enzymes: No results for input(s): "CKTOTAL", "CKMB", "CKMBINDEX", "TROPONINI" in the last 168 hours. BNP (last 3 results) No results for input(s): "PROBNP" in the last 8760 hours. HbA1C: No results for input(s): "HGBA1C" in the last 72 hours. CBG: No results for input(s): "GLUCAP" in the last 168 hours. Lipid Profile: No results for input(s): "CHOL", "HDL", "LDLCALC", "TRIG", "CHOLHDL", "LDLDIRECT" in the last 72 hours. Thyroid Function Tests: Recent Labs    04/30/22 0739  TSH 2.370   Anemia Panel: No results for input(s): "VITAMINB12", "FOLATE", "FERRITIN", "TIBC", "IRON", "RETICCTPCT" in the last 72 hours. Urine analysis: No results found for: "COLORURINE", "APPEARANCEUR", "LABSPEC", "PHURINE", "GLUCOSEU", "HGBUR", "BILIRUBINUR", "KETONESUR", "PROTEINUR", "UROBILINOGEN", "NITRITE", "LEUKOCYTESUR"  Radiological Exams on Admission: I have personally reviewed images DG Chest Port 1 View  Result Date: 04/30/2022 CLINICAL DATA:  Chest pain, palpitations EXAM: PORTABLE CHEST 1 VIEW COMPARISON:  Chest radiograph 09/11/2018 FINDINGS: The cardiomediastinal silhouette is within normal limits There is no focal consolidation or pulmonary edema. There is no pleural  effusion or pneumothorax There is no acute osseous abnormality. IMPRESSION: No radiographic evidence of acute cardiopulmonary process. Electronically Signed   By: Lesia Hausen M.D.   On: 04/30/2022 08:48    EKG: I have personally reviewed EKG: #! - as fib @ 220; #2 (0812 hr) a fib 164; #3 (0827 hr) a fib @ 149 with ? Anteroseptal injury.  Assessment/Plan Active Problems:   Atrial fibrillation with RVR (HCC)   Essential hypertension   GERD with esophagitis   GAD (generalized anxiety disorder)    Assessment and Plan: Atrial fibrillation with RVR (HCC) Patient with previous episodes a fib RVR. Full w/u CAD has been negative. Troponins x 2 nl. She is followed by EP at [facility]. In the past she was intolerant of toprol. She has continued on Diltiazem 180mg  daily. On low dose amytriptyline - unlikely precipitant of RVR.  Plan Tele admit  Continue diltiazem qtt until rate < 120 then resume diltiazem at 240 mg daily  Add coreg 3.125 mg bid  Cardiology consult-requested by EDP and pending.   Essential hypertension BP elevated at admission - stress related (?).  Plan Monitor BP on Diltiazem qtt and coreg  Adjust medications as needed to maintain BP control  GAD (generalized anxiety disorder) Patient with many stressors: works at home, has two children, primary caretaker for her mother with dementia who lives in the home. She was recently started on low dose SSRI  Plan Continue low dose SSRI  GERD with esophagitis Patient has been diagnosed with GERD with esophagitis by EGD. She had a short course of sucralfate but stopped. She does continue on BID PPI. She continues to have chest pressure with a negative cardiac w/u  Plan Continue BID PPI  Add carafate suspension       DVT prophylaxis: Lovenox Code Status: Full Code Family Communication: husband present during interview and exam - no questions  Disposition Plan: home when stable  Consults called: Cardiology - EDP called Dr.   Admission status: Inpatient, Telemetry bed   Lewie Loron, MD [facility] 04/30/2022, 10:31 AM

## 2022-04-30 NOTE — ED Notes (Signed)
Informed RN bed assigned 

## 2022-04-30 NOTE — Subjective & Objective (Signed)
Kelly [name], a 41 y/o married mother of two has a h/o PAF with RVR. Full cardiac w/u negative for CAD. She does follow with EP doctor at [facility]. Additional problems: GERD with esophagitis, HTN, GAD. Today she had onset of very rapid heart rate accompanied by weakness. She presents to [facility]ED for evaluation.

## 2022-04-30 NOTE — Assessment & Plan Note (Signed)
Patient with many stressors: works at home, has two children, primary caretaker for her mother with dementia who lives in the home. She was recently started on low dose SSRI  Plan Continue low dose SSRI

## 2022-04-30 NOTE — Assessment & Plan Note (Signed)
Patient has been diagnosed with GERD with esophagitis by EGD. She had a short course of sucralfate but stopped. She does continue on BID PPI. She continues to have chest pressure with a negative cardiac w/u  Plan Continue BID PPI  Add carafate suspension

## 2022-04-30 NOTE — ED Triage Notes (Signed)
Patient to ED via POV for chest pain/palpitations. Patient states that she woke up this AM with a really heavy chest and checked her fit bit that showed her HR going from 40-140. Hx of afib and currently on meds for same. Started on new anxiety medication this week.

## 2022-04-30 NOTE — ED Provider Notes (Signed)
[facility] Provider Note    Event Date/Time   First MD Initiated Contact with Patient 04/30/22 [phone]     (approximate)   History   Chest Pain   HPI  Kelly [name] is a 41 y.o. female who reports she is not feeling well.  Her face especially around her lips is numb both her hands are numb and her heart rate is very rapid.  She reports she woke up at 5 and again at 6.  She said her Fitbit that she wears told her that she had gone into A-fib while she was sleeping.  Patient had been in A-fib 1 time previously.  Currently she converted spontaneously      Physical Exam   Triage Vital Signs: ED Triage Vitals  Enc Vitals Group     BP 04/30/22 0738 (!) 170/95     Pulse Rate 04/30/22 0738 (!) 197     Resp 04/30/22 0738 (!) 22     Temp 04/30/22 0738 98.7 F (37.1 C)     Temp Source 04/30/22 0738 Oral     SpO2 04/30/22 0738 100 %     Weight 04/30/22 0732 220 lb (99.8 kg)     Height 04/30/22 0732 5\' 10"  (1.778 m)     Head Circumference --      Peak Flow --      Pain Score 04/30/22 0732 0     Pain Loc --      Pain Edu? --      Excl. in [place]? --     Most recent vital signs: Vitals:   04/30/22 1400 04/30/22 1442  BP:  118/77  Pulse: 93 88  Resp: 16 12  Temp:  98.5 F (36.9 C)  SpO2: 96% 99%     General: Awake, looks scared and uncomfortable CV:  Good peripheral perfusion.  Heart rate very tacky up to 224 but good peripheral perfusion as noted previously Resp:  Normal effort.  Lungs are clear Abd:  No distention.  Soft and nontender Brief neuro exam nonfocal   ED Results / Procedures / Treatments   Labs (all labs ordered are listed, but only abnormal results are displayed) Labs Reviewed  BASIC METABOLIC PANEL - Abnormal; Notable for the following components:      Result Value   Potassium 3.0 (*)    Glucose, Bld 111 (*)    All other components within normal limits  CBC - Abnormal; Notable for the following components:   RBC 5.51 (*)     Hemoglobin 16.7 (*)    HCT 48.8 (*)    All other components within normal limits  TSH  MAGNESIUM  HIV ANTIBODY (ROUTINE TESTING W REFLEX)  POC URINE PREG, ED  TROPONIN I (HIGH SENSITIVITY)  TROPONIN I (HIGH SENSITIVITY)     EKG  EKG read and interpreted by me shows A-fib with RVR rate of 220.  Normal axis diffuse ST segment depression felt to be rate related   RADIOLOGY Chest x-ray reviewed and interpreted by me does not show any acute problems.  I will wait for the radiologist reading.   PROCEDURES:  Critical Care performed: Critical care time 20 minutes.  This includes being at the bedside for most of that time while she was getting her diltiazem were trying some vagal maneuvers which did not work and then the Dilt drip which we titrated upwards rapidly.  Additionally I spoke to the cardiologist and the hospital doctor about her later.  Procedures  MEDICATIONS ORDERED IN ED: Medications  diltiazem (CARDIZEM) 125 mg in dextrose 5% 125 mL (1 mg/mL) infusion (10 mg/hr Intravenous Rate/Dose Change 04/30/22 1444)  midazolam PF (VERSED) 10 MG/2ML injection (  Not Given 04/30/22 0754)  LORazepam (ATIVAN) injection 0.5 mg (0.5 mg Intravenous Not Given 04/30/22 0819)  midazolam (VERSED) 2 MG/2ML injection (  Not Given 04/30/22 0823)  midazolam (VERSED) 2 MG/2ML injection (  Not Given 04/30/22 0838)  potassium chloride (KLOR-CON) packet 40 mEq (40 mEq Oral Given 04/30/22 0903)  amitriptyline (ELAVIL) tablet 25 mg (has no administration in time range)  norethindrone-ethinyl estradiol 1/35 (ORTHO-NOVUM) 1-35 MG-MCG per tablet 1 tablet (1 tablet Oral Not Given 04/30/22 1051)  pantoprazole (PROTONIX) EC tablet 80 mg (80 mg Oral Given 04/30/22 1046)  loratadine (CLARITIN) tablet 10 mg (has no administration in time range)  sucralfate (CARAFATE) 1 GM/10ML suspension 1 g (1 g Oral Patient Refused/Not Given 04/30/22 1052)  sodium chloride flush (NS) 0.9 % injection 3 mL (3 mLs  Intravenous Not Given 04/30/22 1051)  sodium chloride flush (NS) 0.9 % injection 3 mL (has no administration in time range)  0.9 %  sodium chloride infusion (has no administration in time range)  acetaminophen (TYLENOL) tablet 650 mg (has no administration in time range)    Or  acetaminophen (TYLENOL) suppository 650 mg (has no administration in time range)  ketorolac (TORADOL) 15 MG/ML injection 15 mg (has no administration in time range)  senna (SENOKOT) tablet 8.6 mg (8.6 mg Oral Patient Refused/Not Given 04/30/22 1051)  metoprolol tartrate (LOPRESSOR) tablet 50 mg (50 mg Oral Given 04/30/22 1533)  apixaban (ELIQUIS) tablet 5 mg (5 mg Oral Given 04/30/22 1533)  diltiazem (CARDIZEM) injection 20 mg ( Intravenous Not Given 04/30/22 0747)  sodium chloride 0.9 % bolus 1,000 mL (0 mLs Intravenous Stopped 04/30/22 1056)  midazolam (VERSED) injection 2 mg (2 mg Intravenous Given 04/30/22 0818)  midazolam (VERSED) injection 2 mg (2 mg Intravenous Given 04/30/22 0825)  potassium chloride 10 mEq in 100 mL IVPB (0 mEq Intravenous Stopped 04/30/22 1032)     IMPRESSION / MDM / ASSESSMENT AND PLAN / ED COURSE  I reviewed the triage vital signs and the nursing notes. ----------------------------------------- 8:09 AM on 04/30/2022 ----------------------------------------- Heart rate is now running between 120 and 140.  Patient feeling somewhat better.  We will plan on getting her in the hospital. Discussed with Dr. Mariah Milling.  I have offered the patient cardioversion here in the emergency department but she does not want to do that she wants to wait to see if the medicines work like they did last time.  We will add a little bit of metoprolol.  See if this works. Differential diagnosis includes, but is not limited to, A-fib due to caffeine.  Patient denies caffeine use A-fib due to alcohol patient denies alcohol use A-fib due to drugs stimulant type patient denies drug use.  Patient reports thyroid has been  checked previously and is negative.  Patient's presentation is most consistent with acute presentation with potential threat to life or bodily function.  The patient is on the cardiac monitor to evaluate for evidence of arrhythmia and/or significant heart rate changes.  Patient remains in A-fib with RVR.      FINAL CLINICAL IMPRESSION(S) / ED DIAGNOSES   Final diagnoses:  Atrial fibrillation with RVR (HCC)     Rx / DC Orders   ED Discharge Orders     None        Note:  This document  was prepared using Conservation officer, historic buildings and may include unintentional dictation errors.   Arnaldo Natal, MD 04/30/22 1556

## 2022-04-30 NOTE — Assessment & Plan Note (Addendum)
Patient with previous episodes a fib RVR. Full w/u CAD has been negative. Troponins x 2 nl. She is followed by EP [facility] In the past she was intolerant of toprol. She has continued on Diltiazem 180mg  daily. On low dose amytriptyline - unlikely precipitant of RVR.  Plan Tele admit  Continue diltiazem qtt until rate < 120 then resume diltiazem at 240 mg daily  Add coreg 3.125 mg bid  Cardiology consult-requested by EDP and pending.

## 2022-04-30 NOTE — Consult Note (Signed)
Cardiology Consultation   Patient ID: Kelly [name] MRN: [id number]; DOB: [birth year]  Admit date: 04/30/2022 Date of Consult: 04/30/2022  PCP:  Dion Body, [facility] Cardiologist:  None  Electrophysiologist:  Vickie Epley, MD  {  Patient Profile:   Kelly [name] is a 41 y.o. female with a hx of paroxysmal Afib, PCOS, HTN who is being seen 04/30/2022 for the evaluation of Afib RVR at the request of Dr. Linda Hedges.  History of Present Illness:   Kelly [name] was diagnosed with AFib back in February 2022 when she presented to the ER with palpitations found to bein rapid afib. She spontaneously converted to NSR. She had another ER visit August and November 2022.   Saw EP 05/2021 and felt patient had mostly sinus tachycardia and 1 real afib episode of Afib. A/c was not started due to 1 Afib occurrence and low CHADSVASC. She was taking Aspirin daily.   Cardiac CTA 09/2021 for elevated troponin showed coronary calcium score of 0, no CAD  Last seen 10/17/21 and was on diltiazem 180mg , increasing diltiazem was discussed, but this was deferred. She was in NSR.   The patient presented to the ER 04/30/22 for heart racing.  Patient woke up last night around 5 AM and noted heart was racing, however she went back to sleep.  She woke up again around 6 and noted heart was still going fast, she felt horrible.  No overt chest pain or shortness of breath.  She did note some lower leg edema.  She had taken diltiazem the night before.  Patient had started new antianxiety medication on Saturday.  She is under a lot of stress at home. The patient decided to come to the ER for further evaluation.  In the ER BP 170/95, pulse 197, RR 22, afebrile, O2. CXR nonacute. EKG showed Afib RVR with heart rate of 220. Started on IV diltiazem.  Labs showed potassium 3.0, sodium 141, normal BUN and creatinine, WBC 10.1, hemoglobin 16.7.  Troponin negative x2.  TSH normal.  Patient  was admitted for further work-up.   Past Medical History:  Diagnosis Date   Anxiety    Atrial fibrillation (HCC)    Depression    Hypertension    PONV (postoperative nausea and vomiting)     Past Surgical History:  Procedure Laterality Date   CESAREAN SECTION N/A    X2   CHOLECYSTECTOMY N/A 01/11/2015   Procedure: LAPAROSCOPIC CHOLECYSTECTOMY WITH INTRAOPERATIVE CHOLANGIOGRAM;  Surgeon: Dia Crawford III, MD;  Location: [facility] ORS;  Service: General;  Laterality: N/A;   ESOPHAGOGASTRODUODENOSCOPY (EGD) WITH PROPOFOL N/A 01/15/2022   Procedure: ESOPHAGOGASTRODUODENOSCOPY (EGD) WITH PROPOFOL;  Surgeon: Lin Landsman, MD;  Location: [facility] ENDOSCOPY;  Service: Gastroenterology;  Laterality: N/A;     Home Medications:  Prior to Admission medications   Medication Sig Start Date End Date Taking? Authorizing Provider  amitriptyline (ELAVIL) 25 MG tablet Take 1 tablet (25 mg total) by mouth at bedtime. 04/22/22 05/22/22 Yes Vanga, Tally Due, MD  aspirin EC 81 MG EC tablet Take 1 tablet (81 mg total) by mouth daily. Swallow whole. 08/27/20  Yes Wieting, Richard, MD  cyanocobalamin (VITAMIN B12) 1000 MCG/ML injection Inject into the muscle. 03/27/22  Yes [provider]  diltiazem (CARDIZEM CD) 180 MG 24 hr capsule Take 1 capsule (180 mg total) by mouth daily. 01/26/22  Yes Haydon Dorris H, PA-C  loratadine (CLARITIN) 10 MG tablet Take 10 mg by mouth at bedtime.  Yes [provider]  omeprazole (PRILOSEC) 40 MG capsule TAKE 1 CAPSULE (40 MG TOTAL) BY MOUTH IN THE MORNING AND ONE CAPSULE AT BEDTIME. 03/16/22  Yes Vanga, Loel Dubonnet, MD  [facility] 1/35 tablet Take by mouth. 03/11/22   [provider]  nitroGLYCERIN (NITROSTAT) 0.4 MG SL tablet Place 1 tablet (0.4 mg total) under the tongue every 5 (five) minutes as needed for chest pain. 09/10/21 12/22/21  Fransico Michael, Landree Fernholz H, PA-C    Inpatient Medications: Scheduled Meds:  amitriptyline  25 mg Oral QHS   aspirin EC  81 mg  Oral Daily   carvedilol  3.125 mg Oral BID WC   enoxaparin (LOVENOX) injection  0.5 mg/kg Subcutaneous Q24H   loratadine  10 mg Oral Daily   LORazepam  0.5 mg Intravenous Once   midazolam       midazolam       midazolam PF       norethindrone-ethinyl estradiol 1/35  1 tablet Oral Daily   pantoprazole  80 mg Oral Daily   potassium chloride  40 mEq Oral Daily   senna  1 tablet Oral BID   sodium chloride flush  3 mL Intravenous Q12H   sucralfate  1 g Oral TID WC & HS   Continuous Infusions:  sodium chloride     diltiazem (CARDIZEM) infusion 15 mg/hr (04/30/22 0752)   PRN Meds: sodium chloride, acetaminophen **OR** acetaminophen, ketorolac, midazolam, midazolam, midazolam PF, sodium chloride flush  Allergies:   No Known Allergies  Social History:   Social History   Socioeconomic History   Marital status: Married    Spouse name: Not on file   Number of children: Not on file   Years of education: Not on file   Highest education level: Not on file  Occupational History   Not on file  Tobacco Use   Smoking status: Never   Smokeless tobacco: Never  Vaping Use   Vaping Use: Never used  Substance and Sexual Activity   Alcohol use: No   Drug use: No   Sexual activity: Yes  Other Topics Concern   Not on file  Social History Narrative   Not on file   Social Determinants of Health   Financial Resource Strain: Not on file  Food Insecurity: Not on file  Transportation Needs: Not on file  Physical Activity: Not on file  Stress: Not on file  Social Connections: Not on file  Intimate Partner Violence: Not on file    Family History:    Family History  Problem Relation Age of Onset   Diabetes Mother    Hypertension Mother    Hyperlipidemia Mother    Heart disease Father    Breast cancer Neg Hx      ROS:  Please see the history of present illness.   All other ROS reviewed and negative.     Physical Exam/Data:   Vitals:   04/30/22 0900 04/30/22 0930 04/30/22 1030  04/30/22 1100  BP: 124/84 121/81 130/82 127/73  Pulse: (!) 108 76 (!) 130 (!) 128  Resp: 14 19  13   Temp:      TempSrc:      SpO2: 98% 99% 98% 100%  Weight:      Height:        Intake/Output Summary (Last 24 hours) at 04/30/2022 1106 Last data filed at 04/30/2022 1056 Gross per 24 hour  Intake 1082.14 ml  Output --  Net 1082.14 ml      04/30/2022  7:32 AM 04/22/2022    3:08 PM 01/15/2022    8:57 AM  Last 3 Weights  Weight (lbs) 220 lb 225 lb 2 oz 220 lb 7.4 oz  Weight (kg) 99.791 kg 102.116 kg 100 kg     Body mass index is 31.57 kg/m.  General:  Well nourished, well developed, in no acute distress HEENT: normal Neck: no JVD Vascular: No carotid bruits; Distal pulses 2+ bilaterally Cardiac:  normal S1, S2; Irreg Irreg, tachy; no murmur  Lungs:  clear to auscultation bilaterally, no wheezing, rhonchi or rales  Abd: soft, nontender, no hepatomegaly  Ext: no edema Musculoskeletal:  No deformities, BUE and BLE strength normal and equal Skin: warm and dry  Neuro:  CNs 2-12 intact, no focal abnormalities noted Psych:  Normal affect   EKG:  The EKG was personally reviewed and demonstrates:  Afib 220bpm, rate related changes Telemetry:  Telemetry was personally reviewed and demonstrates:  Afib HR 170s>130s  Relevant CV Studies:  Cardiac CTA 09/2019 IMPRESSION: 1. Normal coronary calcium score of 0. Patient is low risk for coronary events.   2.  Normal coronary origin with right dominance.   3.  No evidence of CAD.   4.  CAD-RADS 0. Consider non-atherosclerotic causes of chest pain.   Electronically Signed: By: Debbe Odea M.D. On: 09/15/2021 15:57   Heart monitor 2023 HR 49 - 159 bpm, average 78 bpm. Rare supraventricular and ventricular ectopy. No sustained arrhythmias. Patient triggered episodes corresponded to sinus rhythm.   Sheria Lang T. Lalla Brothers, MD, [facility], [facility] Cardiac Electrophysiology   Echo 08/2020  1. Left ventricular ejection fraction, by  estimation, is 55 to 60%. The  left ventricle has normal function. The left ventricle has no regional  wall motion abnormalities. Left ventricular diastolic parameters were  normal.   2. Right ventricular systolic function is normal. The right ventricular  size is normal.   3. The mitral valve is grossly normal. No evidence of mitral valve  regurgitation.   4. The aortic valve is tricuspid. Aortic valve regurgitation is not  visualized.   Laboratory Data:  High Sensitivity Troponin:   Recent Labs  Lab 04/30/22 0739  TROPONINIHS 6     Chemistry Recent Labs  Lab 04/30/22 0739  NA 141  K 3.0*  CL 107  CO2 22  GLUCOSE 111*  BUN 10  CREATININE 0.73  CALCIUM 9.4  GFRNONAA >60  ANIONGAP 12    No results for input(s): "PROT", "ALBUMIN", "AST", "ALT", "ALKPHOS", "BILITOT" in the last 168 hours. Lipids No results for input(s): "CHOL", "TRIG", "HDL", "LABVLDL", "LDLCALC", "CHOLHDL" in the last 168 hours.  Hematology Recent Labs  Lab 04/30/22 0739  WBC 10.1  RBC 5.51*  HGB 16.7*  HCT 48.8*  MCV 88.6  MCH 30.3  MCHC 34.2  RDW 12.4  PLT 302   Thyroid  Recent Labs  Lab 04/30/22 0739  TSH 2.370    BNPNo results for input(s): "BNP", "PROBNP" in the last 168 hours.  DDimer No results for input(s): "DDIMER" in the last 168 hours.   Radiology/Studies:  DG Chest Port 1 View  Result Date: 04/30/2022 CLINICAL DATA:  Chest pain, palpitations EXAM: PORTABLE CHEST 1 VIEW COMPARISON:  Chest radiograph 09/11/2018 FINDINGS: The cardiomediastinal silhouette is within normal limits There is no focal consolidation or pulmonary edema. There is no pleural effusion or pneumothorax There is no acute osseous abnormality. IMPRESSION: No radiographic evidence of acute cardiopulmonary process. Electronically Signed   By: Selena Lesser.D.  On: 04/30/2022 08:48     Assessment and Plan:   Afib RVR -Presented with palpitations/heart racing since this morning found to be in A-fib RVR with  heart rate up to 220 bpm. -PTA diltiazem 180 mg at night -Labs showed potassium 3.0, goal greater than 4 -TSH normal -Check mag -Started on IV diltiazem, rate slowly improving -Patient reports prior intolerance to metoprolol, started on on Coreg 3.125mg  BID -Patient still not feeling great -PTA patient was on aspirin, not on anticoagulation due to 1 A-fib recurrence and low CHA2DS2-VASc -CHA2DS2-VASc of 2 (female, HTN) with start anticoagulation -Patient does not self convert, may need TEE/cardioversion  HTN -Blood pressure elevated on admission -Improving with IV Dilt -Continue to monitor  GAD -Per IM  GERD with esophagitis -Continue PPI  For questions or updates, please contact [facility] Please consult [email or web address] for contact info under    Signed, Maddeline Roorda David Stall, PA-C  04/30/2022 11:06 AM

## 2022-04-30 NOTE — Assessment & Plan Note (Signed)
BP elevated at admission - stress related (?).  Plan Monitor BP on Diltiazem qtt and coreg  Adjust medications as needed to maintain BP control

## 2022-04-30 NOTE — ED Notes (Signed)
Kelly Quest, MD at bedside with patient.

## 2022-05-01 ENCOUNTER — Other Ambulatory Visit: Payer: Self-pay | Admitting: Cardiovascular Disease

## 2022-05-01 ENCOUNTER — Other Ambulatory Visit (HOSPITAL_COMMUNITY): Payer: Self-pay

## 2022-05-01 ENCOUNTER — Other Ambulatory Visit: Payer: Self-pay

## 2022-05-01 DIAGNOSIS — I4891 Unspecified atrial fibrillation: Secondary | ICD-10-CM | POA: Diagnosis not present

## 2022-05-01 DIAGNOSIS — R0602 Shortness of breath: Secondary | ICD-10-CM

## 2022-05-01 DIAGNOSIS — I10 Essential (primary) hypertension: Secondary | ICD-10-CM | POA: Diagnosis not present

## 2022-05-01 LAB — BASIC METABOLIC PANEL
Anion gap: 9 (ref 5–15)
BUN: 11 mg/dL (ref 6–20)
CO2: 21 mmol/L — ABNORMAL LOW (ref 22–32)
Calcium: 8.6 mg/dL — ABNORMAL LOW (ref 8.9–10.3)
Chloride: 111 mmol/L (ref 98–111)
Creatinine, Ser: 0.65 mg/dL (ref 0.44–1.00)
GFR, Estimated: >60 mL/min (ref >60)
Glucose, Bld: 95 mg/dL (ref 70–99)
Potassium: 3.8 mmol/L (ref 3.5–5.1)
Sodium: 141 mmol/L (ref 135–145)

## 2022-05-01 LAB — HIV ANTIBODY (ROUTINE TESTING W REFLEX): HIV Screen 4th Generation wRfx: NONREACTIVE

## 2022-05-01 SURGERY — CARDIOVERSION
Anesthesia: General

## 2022-05-01 MED ORDER — METOPROLOL TARTRATE 50 MG PO TABS: 50.0000 mg | ORAL_TABLET | Freq: Two times a day (BID) | ORAL | 0 refills | Status: DC

## 2022-05-01 MED ORDER — FLECAINIDE ACETATE 100 MG PO TABS: 100.0000 mg | ORAL_TABLET | Freq: Two times a day (BID) | ORAL | 0 refills | Status: DC | PRN

## 2022-05-01 MED ORDER — POTASSIUM CHLORIDE CRYS ER 10 MEQ PO TBCR
10.0000 meq | EXTENDED_RELEASE_TABLET | Freq: Every day | ORAL | Status: DC
  Administered 2022-05-01: 10 meq via ORAL
  Filled 2022-05-01: qty 1

## 2022-05-01 MED ORDER — APIXABAN 5 MG PO TABS: 5.0000 mg | ORAL_TABLET | Freq: Two times a day (BID) | ORAL | 2 refills | Status: DC

## 2022-05-01 NOTE — Progress Notes (Signed)
Rounding Note    Patient Name: Kelly [name] Date of Encounter: 05/01/2022  [facility] Cardiologist: lambert  Subjective   Nothing documented in the chart but by report she converted to normal sinus rhythm in the emergency room around noon. Was transferred to the floor, on telemetry at 2:43 PM was normal sinus rhythm, has remained in normal sinus rhythm since then By patient report diltiazem infusion stopped overnight for bradycardia This morning feels well Patient canceled, diet ordered  Inpatient Medications    Scheduled Meds:  amitriptyline  25 mg Oral QHS   apixaban  5 mg Oral BID   loratadine  10 mg Oral Daily   LORazepam  0.5 mg Intravenous Once   metoprolol tartrate  50 mg Oral BID   norethindrone-ethinyl estradiol 1/35  1 tablet Oral Daily   pantoprazole  80 mg Oral Daily   potassium chloride  40 mEq Oral Daily   senna  1 tablet Oral BID   sodium chloride flush  3 mL Intravenous Q12H   sucralfate  1 g Oral TID WC & HS   Continuous Infusions:  sodium chloride     diltiazem (CARDIZEM) infusion Stopped (05/01/22 0152)   PRN Meds: sodium chloride, acetaminophen **OR** acetaminophen, ketorolac, sodium chloride flush   Vital Signs    Vitals:   05/01/22 0058 05/01/22 0205 05/01/22 0449 05/01/22 0749  BP:  107/70  (!) 128/91  Pulse:  (!) 59  79  Resp:  16  13  Temp: 97.8 F (36.6 C)  97.8 F (36.6 C) 97.8 F (36.6 C)  TempSrc: Oral  Oral Oral  SpO2:  98%  99%  Weight:      Height:        Intake/Output Summary (Last 24 hours) at 05/01/2022 0803 Last data filed at 04/30/2022 1817 Gross per 24 hour  Intake 1562.14 ml  Output 300 ml  Net 1262.14 ml      04/30/2022    7:32 AM 04/22/2022    3:08 PM 01/15/2022    8:57 AM  Last 3 Weights  Weight (lbs) 220 lb 225 lb 2 oz 220 lb 7.4 oz  Weight (kg) 99.791 kg 102.116 kg 100 kg      Telemetry    Normal sinus rhythm- Personally Reviewed  ECG    - Personally Reviewed  Physical Exam    Last mammogram symptoms Kelly [name] states she is of things getting squeezed disease like this topic thanks a lot reports your self and that is going exactly that has been GEN: No acute distress.   Neck: No JVD Cardiac: RRR, no murmurs, rubs, or gallops.  Respiratory: Clear to auscultation bilaterally. GI: Soft, nontender, non-distended  MS: No edema; No deformity. Neuro:  Nonfocal  Psych: Normal affect   Labs    High Sensitivity Troponin:   Recent Labs  Lab 04/30/22 0739 04/30/22 1100  TROPONINIHS 6 15     Chemistry Recent Labs  Lab 04/30/22 0739 04/30/22 0823 05/01/22 0456  NA 141  --  141  K 3.0*  --  3.8  CL 107  --  111  CO2 22  --  21*  GLUCOSE 111*  --  95  BUN 10  --  11  CREATININE 0.73  --  0.65  CALCIUM 9.4  --  8.6*  MG  --  2.3  --   GFRNONAA >60  --  >60  ANIONGAP 12  --  9    Lipids No results for input(s): "CHOL", "TRIG", "  HDL", "LABVLDL", "[place]", "CHOLHDL" in the last 168 hours.  Hematology Recent Labs  Lab 04/30/22 0739  WBC 10.1  RBC 5.51*  HGB 16.7*  HCT 48.8*  MCV 88.6  MCH 30.3  MCHC 34.2  RDW 12.4  PLT 302   Thyroid  Recent Labs  Lab 04/30/22 0739  TSH 2.370    BNPNo results for input(s): "BNP", "PROBNP" in the last 168 hours.  DDimer No results for input(s): "DDIMER" in the last 168 hours.   Radiology    DG Chest Port 1 View  Result Date: 04/30/2022 CLINICAL DATA:  Chest pain, palpitations EXAM: PORTABLE CHEST 1 VIEW COMPARISON:  Chest radiograph 09/11/2018 FINDINGS: The cardiomediastinal silhouette is within normal limits There is no focal consolidation or pulmonary edema. There is no pleural effusion or pneumothorax There is no acute osseous abnormality. IMPRESSION: No radiographic evidence of acute cardiopulmonary process. Electronically Signed   By: Valetta Mole M.D.   On: 04/30/2022 08:48    Cardiac Studies   Nerve-racking last time she had 1  Patient Profile     Kelly [name] is a 41 y.o. female with a hx  of paroxysmal Afib, PCOS, HTN who is being seen 04/30/2022 for the evaluation of Afib RVR   Assessment & Plan    Atrial fibrillation with RVR Prior episode in February 2022, converted in the hospital with medical management This admission presenting with rate to 220 difficult to control, Eventually improved rate on diltiazem infusion Given metoprolol tartrate 50 twice daily with her diltiazem infusion, converted in the emergency room noon yesterday  -- Discussed first treatment options with her, she reports that she is interested in cardioversion given her young age and is now with second episode of atrial fibrillation with no trigger -We have recommended as needed medications, will give her prescription for flecainide 100 to take as needed with metoprolol tartrate 50 for recurrent episodes -Potassium 10 mill equivalents daily given chronic hypokalemia -Recommend she stay on her diltiazem ER 180 daily -She is able to monitor for arrhythmia using her Fitbit that has A-fib detection   Hypertension Continue diltiazem ER 180 daily   Anxiety Will defer to primary care, on anxiety medication   GERD with esophagitis On PPI    Total encounter time more than 40 minutes  Greater than 50% was spent in counseling and coordination of care with the patient    For questions or updates, please contact [place] Please consult [email or web address] for contact info under        Signed, Ida Rogue, MD  05/01/2022, 8:03 AM

## 2022-05-01 NOTE — Discharge Summary (Signed)
Physician Discharge Summary  Charlen Nemeroff [facility] [id number] DOB: 15-Aug-1980 DOA: 04/30/2022  PCP: Dion Body, MD  Admit date: 04/30/2022  Discharge date: 05/01/2022  Admitted From: Home.  Disposition:  Home.  Recommendations for Outpatient Follow-up:  Follow up with PCP in 1-2 weeks. Please obtain BMP/CBC in one week. Advised to take Cardizem 180 mg daily, metoprolol 50 mg every 12 hours as needed. Advised to take flecainide 100 mg twice daily as needed. Advised to follow-up with cardiology  Home Health: None Equipment/Devices: None  Discharge Condition: Stable CODE STATUS:Full code Diet recommendation: Heart Healthy   Brief [facility] Course: Mrs. Shipper, a 41 y/o married mother of two has a h/o PAF with RVR. She had Full cardiac w/u negative for CAD. She does follow with EP doctor at [facility]. Additional problems: GERD with esophagitis, HTN, GAD. Today she had onset of very rapid heart rate accompanied by weakness. She presents to [facility]-ED for evaluation.   She had HR 220 down to 136 with diltiazem qtt. No indication for cardioversion - BP elevated, Chest pain - noncardiac. Lab: K 3.0 - had replacement in ED with 10 meq IV and oral K 40 meq started. CBC nl, TSH 2.37 (nl). CXR-NAD. EDP called Cardiology. TRH called to admit for continued mgt PAF-RVR. Patient was admitted for atrial fibrillation with RVR. She was started on Cardizem infusion. She converted into normal sinus rhythm and she continued to remain in sinus rhythm.  Cardizem infusion discontinued.  Patient was evaluated by cardiology.  Patient feels better and wants to be discharged.  Cardiology agreed with the discharge planning.  Patient was advised to continue Cardizem 180 mg daily, metoprolol 50 mg every 12 hours, advised to take flecainide 100 mg twice daily is needed for A-fib with RVR.  Patient advised to follow-up with cardiologist in 1 week.  Discharge Diagnoses:  Principal Problem:   Atrial  fibrillation with RVR ([place]) Active Problems:   Benign essential HTN   GERD with esophagitis   GAD (generalized anxiety disorder)   SOB (shortness of breath)  Atrial fibrillation with RVR (HCC) Patient has history of A-fib with RVR. Full w/u CAD has been negative. Troponins x 2 nl.  She is followed by EP at [facility].  In the past she was intolerant of toprol. She was continued on Diltiazem 180mg  daily.   She was continued on Cardizem infusion, she converted into normal sinus rhythm and continued to remain in sinus rhythm. Discussed with cardiology since patient has CHA2DS2-VASc score of 1, does not require anticoagulation. Patient is being discharged home   Essential hypertension Continue Cardizem, metoprolol.   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 She was recently started on low dose SSRI   Plan     Continue low dose SSRI   GERD with esophagitis Patient has been diagnosed with GERD with esophagitis by EGD. She had a short course of sucralfate but stopped. She does continue on BID PPI. She continues to have chest pressure with a negative cardiac w/u   Plan     Continue BID PPI             Add carafate suspension  Discharge Instructions  Discharge Instructions     Call MD for:  difficulty breathing, headache or visual disturbances   Complete by: As directed    Call MD for:  persistant dizziness or light-headedness   Complete by:  As directed    Call MD for:  persistant nausea and vomiting   Complete by: As directed    Diet - low sodium heart healthy   Complete by: As directed    Diet Carb Modified   Complete by: As directed    Discharge instructions   Complete by: As directed    Advised to follow-up with primary care physician in 1 week. Advised to take Cardizem 180 mg daily, metoprolol 50 mg every 12 hours as needed. Advised to take  Eliquis 5 mg twice daily for atrial fibrillation. Advised to follow-up with cardiology   Increase activity slowly   Complete by: As directed       Allergies as of 05/01/2022   No Known Allergies      Medication List     TAKE these medications    ALAYCEN 1/35 tablet Generic drug: norethindrone-ethinyl estradiol 1/35 Take by mouth.   amitriptyline 25 MG tablet Commonly known as: ELAVIL Take 1 tablet (25 mg total) by mouth at bedtime.   aspirin EC 81 MG tablet Take 1 tablet (81 mg total) by mouth daily. Swallow whole.   cyanocobalamin 1000 MCG/ML injection Commonly known as: VITAMIN B12 Inject into the muscle.   diltiazem 180 MG 24 hr capsule Commonly known as: CARDIZEM CD Take 1 capsule (180 mg total) by mouth daily.   flecainide 100 MG tablet Commonly known as: TAMBOCOR Take 1 tablet (100 mg total) by mouth 2 (two) times daily as needed (for atrial fibrillation).   loratadine 10 MG tablet Commonly known as: CLARITIN Take 10 mg by mouth at bedtime.   metoprolol tartrate 50 MG tablet Commonly known as: LOPRESSOR Take 1 tablet (50 mg total) by mouth 2 (two) times daily.   nitroGLYCERIN 0.4 MG SL tablet Commonly known as: NITROSTAT Place 1 tablet (0.4 mg total) under the tongue every 5 (five) minutes as needed for chest pain.   omeprazole 40 MG capsule Commonly known as: PRILOSEC TAKE 1 CAPSULE (40 MG TOTAL) BY MOUTH IN THE MORNING AND ONE CAPSULE AT BEDTIME.        Follow-up Information     Marisue Ivan, MD. Go in 1 week(s).   Specialty: Family Medicine Why: Appointment on Tuesday, 05/12/2022 at 10:45am. Contact information: [address] [facility] [place] [address] [phone]         Antonieta Iba, MD. Go in 1 week(s).   Specialty: Cardiology Why: Appointment on Tuesday, 05/12/2022 at 8:25am with Marlis Edelson. Contact information: [address] [place] [address] [phone]                 No Known Allergies  Consultations: Cardiology   Procedures/Studies: DG Chest Port 1 View  Result Date: 04/30/2022 CLINICAL DATA:  Chest pain, palpitations EXAM: PORTABLE CHEST 1 VIEW COMPARISON:  Chest radiograph 09/11/2018 FINDINGS: The cardiomediastinal silhouette is within normal limits There is no focal consolidation or pulmonary edema. There is no pleural effusion or pneumothorax There is no acute osseous abnormality. IMPRESSION: No radiographic evidence of acute cardiopulmonary process. Electronically Signed   By: Lesia Hausen M.D.   On: 04/30/2022 08:48   MM 3D SCREEN BREAST BILATERAL  Result Date: 04/16/2022 CLINICAL DATA:  Screening. EXAM: DIGITAL SCREENING BILATERAL MAMMOGRAM WITH TOMOSYNTHESIS AND CAD TECHNIQUE: Bilateral screening digital craniocaudal and mediolateral oblique mammograms were obtained. Bilateral screening digital breast tomosynthesis was performed. The images were evaluated with computer-aided detection. COMPARISON:  Previous exam(s). ACR Breast Density Category c: The  breast tissue is heterogeneously dense, which may obscure small masses. FINDINGS: There are no findings suspicious for malignancy. IMPRESSION: No mammographic evidence of malignancy. A result letter of this screening mammogram will be mailed directly to the patient. RECOMMENDATION: Screening mammogram in one year. (Code:SM-B-01Y) BI-RADS CATEGORY  1: Negative. Electronically Signed   By: Marin Olp M.D.   On: 04/16/2022 11:32     Subjective: Patient was seen and examined at bedside.  Overnight events noted.   Patient reports doing much better wants to be discharged. She denies any chest pain, shortness of breath, dizziness, palpitations.  Discharge Exam: Vitals:   05/01/22 0449 05/01/22 0749  BP:  (!) 128/91  Pulse:  79  Resp:  13  Temp: 97.8 F (36.6 C) 97.8 F (36.6 C)  SpO2:  99%   Vitals:   05/01/22 0058 05/01/22 0205 05/01/22 0449 05/01/22 0749  BP:  107/70  (!)  128/91  Pulse:  (!) 59  79  Resp:  16  13  Temp: 97.8 F (36.6 C)  97.8 F (36.6 C) 97.8 F (36.6 C)  TempSrc: Oral  Oral Oral  SpO2:  98%  99%  Weight:      Height:        General: Pt is alert, awake, not in acute distress Cardiovascular: RRR, S1/S2 +, no rubs, no gallops Respiratory: CTA bilaterally, no wheezing, no rhonchi Abdominal: Soft, NT, ND, bowel sounds + Extremities: no edema, no cyanosis    The results of significant diagnostics from this hospitalization (including imaging, microbiology, ancillary and laboratory) are listed below for reference.     Microbiology: No results found for this or any previous visit (from the past 240 hour(s)).   Labs: BNP (last 3 results) No results for input(s): "BNP" in the last 8760 hours. Basic Metabolic Panel: Recent Labs  Lab 04/30/22 0739 04/30/22 0823 05/01/22 0456  NA 141  --  141  K 3.0*  --  3.8  CL 107  --  111  CO2 22  --  21*  GLUCOSE 111*  --  95  BUN 10  --  11  CREATININE 0.73  --  0.65  CALCIUM 9.4  --  8.6*  MG  --  2.3  --    Liver Function Tests: No results for input(s): "AST", "ALT", "ALKPHOS", "BILITOT", "PROT", "ALBUMIN" in the last 168 hours. No results for input(s): "LIPASE", "AMYLASE" in the last 168 hours. No results for input(s): "AMMONIA" in the last 168 hours. CBC: Recent Labs  Lab 04/30/22 0739  WBC 10.1  HGB 16.7*  HCT 48.8*  MCV 88.6  PLT 302   Cardiac Enzymes: No results for input(s): "CKTOTAL", "CKMB", "CKMBINDEX", "TROPONINI" in the last 168 hours. BNP: Invalid input(s): "POCBNP" CBG: No results for input(s): "GLUCAP" in the last 168 hours. D-Dimer No results for input(s): "DDIMER" in the last 72 hours. Hgb A1c No results for input(s): "HGBA1C" in the last 72 hours. Lipid Profile No results for input(s): "CHOL", "HDL", "LDLCALC", "TRIG", "CHOLHDL", "LDLDIRECT" in the last 72 hours. Thyroid function studies Recent Labs    04/30/22 0739  TSH 2.370   Anemia work  up No results for input(s): "VITAMINB12", "FOLATE", "FERRITIN", "TIBC", "IRON", "RETICCTPCT" in the last 72 hours. Urinalysis No results found for: "COLORURINE", "APPEARANCEUR", "LABSPEC", "PHURINE", "GLUCOSEU", "HGBUR", "BILIRUBINUR", "KETONESUR", "PROTEINUR", "UROBILINOGEN", "NITRITE", "LEUKOCYTESUR" Sepsis Labs Recent Labs  Lab 04/30/22 0739  WBC 10.1   Microbiology No results found for this or any previous visit (from the past 240 hour(s)).  Time coordinating discharge: Over 30 minutes  SIGNED:   Shawna Clamp, MD  [facility] 05/01/2022, 2:53 PM Pager   If 7PM-7AM, please contact night-coverage

## 2022-05-01 NOTE — Discharge Instructions (Signed)
Advised to follow-up with primary care physician in 1 week. Advised to take Cardizem 180 mg daily, metoprolol 50 mg every 12 hours as needed. Advised to take Eliquis 5 mg twice daily for atrial fibrillation. Advised to follow-up with cardiology

## 2022-05-01 NOTE — TOC CM/SW Note (Signed)
Patient has order to discharge home today. Chart reviewed. No TOC needs identified. CSW signing off.  Dayton Scrape, [place]

## 2022-05-04 ENCOUNTER — Other Ambulatory Visit: Payer: Self-pay

## 2022-05-04 NOTE — Telephone Encounter (Signed)
Last office visit 04/22/2022 Last refill 04/22/2022 0 refills

## 2022-05-05 MED ORDER — AMITRIPTYLINE HCL 25 MG PO TABS: 25.0000 mg | ORAL_TABLET | Freq: Every day | ORAL | 0 refills | Status: DC

## 2022-05-12 ENCOUNTER — Ambulatory Visit: Payer: No Typology Code available for payment source | Attending: Nurse Practitioner | Admitting: Nurse Practitioner

## 2022-05-12 ENCOUNTER — Encounter: Payer: Self-pay | Admitting: Nurse Practitioner

## 2022-05-12 VITALS — BP 136/82 | HR 91 | Ht 70.0 in | Wt 222.2 lb

## 2022-05-12 DIAGNOSIS — I10 Essential (primary) hypertension: Secondary | ICD-10-CM | POA: Diagnosis not present

## 2022-05-12 DIAGNOSIS — I480 Paroxysmal atrial fibrillation: Secondary | ICD-10-CM

## 2022-05-12 DIAGNOSIS — G4730 Sleep apnea, unspecified: Secondary | ICD-10-CM

## 2022-05-12 NOTE — Patient Instructions (Signed)
Medication Instructions:  No changes at this time.   *If you need a refill on your cardiac medications before your next appointment, please call your pharmacy*   Lab Work: None  If you have labs (blood work) drawn today and your tests are completely normal, you will receive your results only by: [place] (if you have MyChart) OR A paper copy in the mail If you have any lab test that is abnormal or we need to change your treatment, we will call you to review the results.   Testing/Procedures: None   Follow-Up: At [facility], you and your health needs are our priority.  As part of our continuing mission to provide you with exceptional heart care, we have created designated Provider Care Teams.  These Care Teams include your primary Cardiologist (physician) and Advanced Practice Providers (APPs -  Physician Assistants and Nurse Practitioners) who all work together to provide you with the care you need, when you need it.   Your next appointment:   2 month(s)  The format for your next appointment:   In Person  Provider:   Dr. Quentin Ore   Appointment with Barrett Henle PA-C has been cancelled  Other Instructions Referral placed for pulmonary and they will give you a call to schedule. Their number is [phone]    Important Information About Sugar

## 2022-05-12 NOTE — Progress Notes (Signed)
Office Visit    Patient Name: Kelly [name] Date of Encounter: 05/12/2022  Primary Care Provider:  Marisue Ivan, MD Primary Cardiologist:  Lanier Prude, MD  Chief Complaint    41 y/o ? w/ a h/o PAF, PCOS, HTN, and depression, who presents for f/u after recent hospitalization for recurrent afib.   Past Medical History    Past Medical History:  Diagnosis Date   Anxiety    Demand ischemia    a. In setting of rapid afib-->09/2021 Cor CTA: Ca2+ = 0. Nl cors. Sm hiatal hernia.   Depression    History of echocardiogram    a. 08/2020 Echo: EF 55-60%, no rmwa, nl RV fxn.   Hypertension    Morbid obesity (HCC)    PAF (paroxysmal atrial fibrillation) (HCC)    a. 08/2020 -> CHA2DS2VASc = 2-->ASA only; b. 08/2021 [place]: Predominantly sinus rhythm with brief run of SVT and no sustained arrhythmias.  Triggered events associate with sinus rhythm; c.  04/2022 recurrent A-fib: As needed flecainide and eliquis Rx.   PONV (postoperative nausea and vomiting)    Sleep-disordered breathing    Past Surgical History:  Procedure Laterality Date   CESAREAN SECTION N/A    X2   CHOLECYSTECTOMY N/A 01/11/2015   Procedure: LAPAROSCOPIC CHOLECYSTECTOMY WITH INTRAOPERATIVE CHOLANGIOGRAM;  Surgeon: Tiney Rouge III, MD;  Location: [facility] ORS;  Service: General;  Laterality: N/A;   ESOPHAGOGASTRODUODENOSCOPY (EGD) WITH PROPOFOL N/A 01/15/2022   Procedure: ESOPHAGOGASTRODUODENOSCOPY (EGD) WITH PROPOFOL;  Surgeon: Toney Reil, MD;  Location: [facility] ENDOSCOPY;  Service: Gastroenterology;  Laterality: N/A;    Allergies  No Known Allergies  History of Present Illness     41 year old female with a history of paroxysmal atrial fibrillation, PCOS, hypertension, and depression.  In February 2022, she presented to the emergency department with palpitations and found to be in rapid atrial fibrillation.  She spontaneous converted to sinus rhythm.  Echocardiogram at that time showed normal LV function  without significant valvular disease.  She had repeat ED evaluations in August and again in November 2022 in the setting of tachycardia which was felt to be mostly sinus tachycardia.  She was evaluated by EP in November 2022 and given overall paucity of A-fib, she was not felt to require anticoagulation.  In February 2023, she underwent event monitoring which showed predominantly sinus rhythm without sustained arrhythmias.  Brief runs of SVT noted.  Triggered events were associated with sinus rhythm.  Decision was made to continue diltiazem 180 mg daily.  Cardiac CTA in March 2023 showed a calcium score of 0 and normal coronary arteries.  Ms. Rawe presented to the emergency department on October 26 after awakening with tachypalpitations.  She was found to be in atrial fibrillation at a rate of 220 bpm.  Troponins were normal.  She was placed on intravenous diltiazem and subsequently oral beta-blocker therapy, and converted to sinus rhythm.  Flecainide 100 mg twice daily as needed was prescribed in addition to metoprolol 50 mg and eliquis 5mg  as needed.  She was subsequently discharged home.  Since d/c, she has done well w/o recurrent palpitations or known afib.  She continues to feel somewhat run down.  In discussing this further, she notes that she has had a lot of stress in her life recently.  Her mother has dementia and was living with her up until her hospitalization, at which time it was determined that her mother fell and broke her elbow and she is now currently in skilled  rehab with hopes that she will go to skilled nursing from here.  Patient notes a history of poor sleep.  She does not know if she snores but does experience daytime somnolence and fatigue and has for quite a long time.  She has never had a sleep study.  She denies chest pain, dyspnea, palpitations, PND, orthopnea, dizziness, syncope, edema, or early satiety.  Home Medications    Current Outpatient Medications  Medication Sig Dispense  Refill   ALAYCEN 1/35 tablet Take by mouth.     amitriptyline (ELAVIL) 25 MG tablet Take 1 tablet (25 mg total) by mouth at bedtime. 30 tablet 0   aspirin EC 81 MG EC tablet Take 1 tablet (81 mg total) by mouth daily. Swallow whole. 30 tablet 0   cyanocobalamin (VITAMIN B12) 1000 MCG/ML injection Inject into the muscle.     diltiazem (CARDIZEM CD) 180 MG 24 hr capsule Take 1 capsule (180 mg total) by mouth daily. 30 capsule 3   loratadine (CLARITIN) 10 MG tablet Take 10 mg by mouth at bedtime.     nitroGLYCERIN (NITROSTAT) 0.4 MG SL tablet Place 1 tablet (0.4 mg total) under the tongue every 5 (five) minutes as needed for chest pain. 10 tablet 0   omeprazole (PRILOSEC) 40 MG capsule TAKE 1 CAPSULE (40 MG TOTAL) BY MOUTH IN THE MORNING AND ONE CAPSULE AT BEDTIME. 180 capsule 3   ELIQUIS 5 MG TABS tablet Take 5 mg by mouth 2 (two) times daily. (Patient not taking: Reported on 05/12/2022)     flecainide (TAMBOCOR) 100 MG tablet Take 1 tablet (100 mg total) by mouth 2 (two) times daily as needed (for atrial fibrillation). (Patient not taking: Reported on 05/12/2022) 60 tablet 0   metoprolol tartrate (LOPRESSOR) 50 MG tablet Take 1 tablet (50 mg total) by mouth 2 (two) times daily. (Patient not taking: Reported on 05/12/2022) 60 tablet 0   No current facility-administered medications for this visit.     Review of Systems    Daytime fatigue.  She denies chest pain, palpitations, dyspnea, pnd, orthopnea, n, v, dizziness, syncope, edema, weight gain, or early satiety.  All other systems reviewed and are otherwise negative except as noted above.    Physical Exam    VS:  BP 136/82 (BP Location: Left Arm, Patient Position: Sitting, Cuff Size: Normal)   Pulse 91   Ht 5\' 10"  (1.778 m)   Wt 222 lb 3.2 oz (100.8 kg)   LMP 04/15/2022   SpO2 99%   BMI 31.88 kg/m  , BMI Body mass index is 31.88 kg/m. STOP-Bang Score:  4      GEN: Well nourished, well developed, in no acute distress. HEENT:  normal. Neck: Supple, no JVD, carotid bruits, or masses. Cardiac: RRR, no murmurs, rubs, or gallops. No clubbing, cyanosis, edema.  Radials/PT 2+ and equal bilaterally.  Respiratory:  Respirations regular and unlabored, clear to auscultation bilaterally. GI: Soft, nontender, nondistended, BS + x 4. MS: no deformity or atrophy. Skin: warm and dry, no rash. Neuro:  Strength and sensation are intact. Psych: Normal affect.  Accessory Clinical Findings    ECG personally reviewed by me today -regular sinus rhythm, 91, septal infarct- no acute changes.  Lab Results  Component Value Date   WBC 10.1 04/30/2022   HGB 16.7 (H) 04/30/2022   HCT 48.8 (H) 04/30/2022   MCV 88.6 04/30/2022   PLT 302 04/30/2022   Lab Results  Component Value Date   CREATININE 0.65 05/01/2022   BUN  11 05/01/2022   NA 141 05/01/2022   K 3.8 05/01/2022   CL 111 05/01/2022   CO2 21 (L) 05/01/2022   Lab Results  Component Value Date   ALT 22 09/10/2021   AST 26 09/10/2021   ALKPHOS 45 09/10/2021   BILITOT 0.4 09/10/2021    Assessment & Plan    1.  Paroxysmal atrial fibrillation: Patient with 2 episodes of atrial fibrillation, the first occurring in February 2022 and the second occurring in late October.  She converted spontaneously on IV diltiazem and oral metoprolol during most recent hospitalization, and has had no recurrence of arrhythmias since discharge.  She was prescribed as needed flecainide 100 mg, metoprolol 50 mg, and Eliquis 5 mg (CHA2DS2-VASc equals 2).  It is her preference not to be on daily Eliquis if not absolutely necessary.  She is also on background therapy of diltiazem 180 mg daily.  She thinks that stress has been playing a significant role in her recurrent arrhythmia, as she has been dealing with her mother who has dementia.  In discussing her sleep habits, she does have some history of snoring and daytime somnolence with a STOP-BANG of 4.  I will refer to pulmonology for sleep evaluation.   Patient is in that and following up with Dr. Quentin Ore to discuss potential for ablative options versus daily use of antiarrhythmic therapy.  2.  Essential hypertension: Blood pressure mildly elevated today though she notes it runs more normally at home and says she is just anxious.  She remains on diltiazem 180 mg daily.  3.  Sleep disordered breathing: Patient with some history of snoring and regular daytime somnolence and fatigue.  With this and in the context of paroxysmal atrial fibrillation despite relative youth, we will arrange for pulmonology evaluation for sleep study.  4.  Morbid obesity: We discussed the role of healthy lifestyle and weight management and managing atrial fibrillation and encouraged at least 30 minutes of moderate exercise daily as well as as close to a plant-based diet as she is comfortable achieving.  5.  Disposition: Refer to pulmonology for sleep evaluation.  Follow-up with Dr. Quentin Ore within the next 1 to 2 months.   Murray Hodgkins, NP 05/12/2022, 9:14 AM

## 2022-05-15 ENCOUNTER — Encounter: Payer: Self-pay | Admitting: Primary Care

## 2022-05-15 ENCOUNTER — Ambulatory Visit (INDEPENDENT_AMBULATORY_CARE_PROVIDER_SITE_OTHER): Payer: No Typology Code available for payment source | Admitting: Primary Care

## 2022-05-15 VITALS — BP 136/80 | HR 89 | Temp 98.0°F | Ht 70.0 in | Wt 217.4 lb

## 2022-05-15 DIAGNOSIS — F411 Generalized anxiety disorder: Secondary | ICD-10-CM

## 2022-05-15 DIAGNOSIS — G4719 Other hypersomnia: Secondary | ICD-10-CM | POA: Diagnosis not present

## 2022-05-15 DIAGNOSIS — I4891 Unspecified atrial fibrillation: Secondary | ICD-10-CM | POA: Diagnosis not present

## 2022-05-15 DIAGNOSIS — I10 Essential (primary) hypertension: Secondary | ICD-10-CM | POA: Diagnosis not present

## 2022-05-15 NOTE — Progress Notes (Signed)
@Patient  ID: Kelly [name], female    DOB: 1980-09-12, 41 y.o.   MRN: [id number]  Chief Complaint  Patient presents with   sleep consult    Daytime sleepiness and restless sleep. No prior sleep study.     Referring provider: Wilder Glade*  HPI: 40 year old female, never smoked. PMH significant for paroxysmal afib, PCOS, HTN, depression/anxiety.  05/15/2022 Patient presents today for sleep consult. She has a history of recurrent afib, she was first hospitalized in February 2022 for rapid afib. She was hospitalized again in October 2023 for afib. She is following with cardiology, she saw Ignacia Bayley on November 7th 2023. She has a lot of stress in her life. Her mother has dementia and is currently in skilled rehab with hopes to go to skilled nursing facility. Cardiology is considering potential for ablation options vs daily antiarrhythmics.   There is concern she could have underlying sleep apnea. She has symptoms of insomnia, daytime sleepiness and restless sleep. No snoring symptoms or witnessed apnea that she is aware of. She is exhausted all the time. Not sleeping well.  Last night she was awake from 3-5am. She wakes up a lot at night, her sleep is very restless. She stopped Amitriptyline three days ago and was started on Lexapro 5mg  per PCP. She was sleeping better when she was on Amitriptyline but medication was changed to d/t anxiety.   Sleep questionnaire Symptoms- insomnia, palpitations, depression, afib, daytime sleepiness  Prior sleep study- None Bedtime- 9:30-10:30pm Time to fall asleep- 30-60 mins  Nocturnal awakenings- 3- 4 times Start of day- 6 or 7am  Weight changes- down 40lbs CPAP use- no Oxygen use-no  Epworth score- 3   No Known Allergies  Immunization History  Administered Date(s) Administered   Influenza Split 04/25/2014   PFIZER Comirnaty([place])Covid-19 Tri-Sucrose Vaccine 03/03/2020, 04/06/2020   Tdap 05/08/2014    Past Medical  History:  Diagnosis Date   Anxiety    Demand ischemia    a. In setting of rapid afib-->09/2021 Cor CTA: Ca2+ = 0. Nl cors. Sm hiatal hernia.   Depression    History of echocardiogram    a. 08/2020 Echo: EF 55-60%, no rmwa, nl RV fxn.   Hypertension    Morbid obesity ([place])    PAF (paroxysmal atrial fibrillation) ([place])    a. 08/2020 -> CHA2DS2VASc = 2-->ASA only; b. 08/2021 [facility]: Predominantly sinus rhythm with brief run of SVT and no sustained arrhythmias.  Triggered events associate with sinus rhythm; c.  04/2022 recurrent A-fib: As needed flecainide and eliquis Rx.   PONV (postoperative nausea and vomiting)    Sleep-disordered breathing     Tobacco History: Social History   Tobacco Use  Smoking Status Never  Smokeless Tobacco Never   Counseling given: Not Answered   Outpatient Medications Prior to Visit  Medication Sig Dispense Refill   ALAYCEN 1/35 tablet Take by mouth.     aspirin EC 81 MG EC tablet Take 1 tablet (81 mg total) by mouth daily. Swallow whole. 30 tablet 0   cyanocobalamin (VITAMIN B12) 1000 MCG/ML injection Inject into the muscle.     diltiazem (CARDIZEM CD) 180 MG 24 hr capsule Take 1 capsule (180 mg total) by mouth daily. 30 capsule 3   ELIQUIS 5 MG TABS tablet Take 5 mg by mouth 2 (two) times daily.     escitalopram (LEXAPRO) 5 MG tablet Take 5 mg by mouth daily.     flecainide (TAMBOCOR) 100 MG tablet Take 1  tablet (100 mg total) by mouth 2 (two) times daily as needed (for atrial fibrillation). 60 tablet 0   loratadine (CLARITIN) 10 MG tablet Take 10 mg by mouth at bedtime.     metoprolol tartrate (LOPRESSOR) 50 MG tablet Take 1 tablet (50 mg total) by mouth 2 (two) times daily. 60 tablet 0   nitroGLYCERIN (NITROSTAT) 0.4 MG SL tablet Place 1 tablet (0.4 mg total) under the tongue every 5 (five) minutes as needed for chest pain. 10 tablet 0   omeprazole (PRILOSEC) 40 MG capsule TAKE 1 CAPSULE (40 MG TOTAL) BY MOUTH IN THE MORNING AND ONE CAPSULE AT BEDTIME. 180  capsule 3   amitriptyline (ELAVIL) 25 MG tablet Take 1 tablet (25 mg total) by mouth at bedtime. (Patient not taking: Reported on 05/15/2022) 30 tablet 0   No facility-administered medications prior to visit.    Review of Systems  Review of Systems  Constitutional:  Positive for fatigue.  Respiratory: Negative.  Negative for apnea.   Cardiovascular:  Negative for chest pain and leg swelling.  Psychiatric/Behavioral:  Positive for sleep disturbance. Negative for suicidal ideas.    Physical Exam  BP 136/80 (BP Location: Left Arm, Cuff Size: Normal)   Pulse 89   Temp 98 F (36.7 C) (Temporal)   Ht 5\' 10"  (1.778 m)   Wt 217 lb 6.4 oz (98.6 kg)   LMP 04/15/2022   SpO2 99%   BMI 31.19 kg/m  Physical Exam Constitutional:      Appearance: Normal appearance.  HENT:     Head: Normocephalic and atraumatic.     Mouth/Throat:     Mouth: Mucous membranes are moist.     Pharynx: Oropharynx is clear.  Cardiovascular:     Rate and Rhythm: Normal rate and regular rhythm.     Heart sounds: No murmur heard.    Comments: Regular  Pulmonary:     Effort: Pulmonary effort is normal.     Breath sounds: Normal breath sounds. No wheezing or rhonchi.  Musculoskeletal:        General: Normal range of motion.  Skin:    General: Skin is warm and dry.  Neurological:     General: No focal deficit present.     Mental Status: She is alert and oriented to person, place, and time. Mental status is at baseline.  Psychiatric:        Behavior: Behavior normal.        Thought Content: Thought content normal.        Judgment: Judgment normal.     Comments: Depressed mood      Lab Results:  CBC    Component Value Date/Time   WBC 10.1 04/30/2022 0739   RBC 5.51 (H) 04/30/2022 0739   HGB 16.7 (H) 04/30/2022 0739   HGB 12.0 07/23/2014 1354   HCT 48.8 (H) 04/30/2022 0739   HCT 36.9 07/23/2014 1354   PLT 302 04/30/2022 0739   PLT 173 07/23/2014 1354   MCV 88.6 04/30/2022 0739   MCV 96  07/23/2014 1354   MCH 30.3 04/30/2022 0739   MCHC 34.2 04/30/2022 0739   RDW 12.4 04/30/2022 0739   RDW 14.0 07/23/2014 1354   LYMPHSABS 3.0 08/26/2020 1113   LYMPHSABS 0.9 (L) 07/23/2014 1354   MONOABS 1.1 (H) 08/26/2020 1113   MONOABS 0.5 07/23/2014 1354   EOSABS 0.1 08/26/2020 1113   EOSABS 0.0 07/23/2014 1354   BASOSABS 0.0 08/26/2020 1113   BASOSABS 0.0 07/23/2014 1354    BMET  Component Value Date/Time   NA 141 05/01/2022 0456   NA 142 09/29/2021 0847   NA 142 07/23/2014 1354   K 3.8 05/01/2022 0456   K 3.6 07/23/2014 1354   CL 111 05/01/2022 0456   CL 109 (H) 07/23/2014 1354   CO2 21 (L) 05/01/2022 0456   CO2 24 07/23/2014 1354   GLUCOSE 95 05/01/2022 0456   GLUCOSE 148 (H) 07/23/2014 1354   BUN 11 05/01/2022 0456   BUN 8 09/29/2021 0847   BUN 6 (L) 07/23/2014 1354   CREATININE 0.65 05/01/2022 0456   CREATININE 0.62 07/23/2014 1354   CALCIUM 8.6 (L) 05/01/2022 0456   CALCIUM 8.4 (L) 07/23/2014 1354   GFRNONAA >60 05/01/2022 0456   GFRNONAA >60 07/23/2014 1354   GFRAA >60 01/11/2015 0315   GFRAA >60 07/23/2014 1354    BNP    Component Value Date/Time   BNP 92.7 08/26/2020 1136    ProBNP No results found for: "PROBNP"  Imaging: DG Chest Port 1 View  Result Date: 04/30/2022 CLINICAL DATA:  Chest pain, palpitations EXAM: PORTABLE CHEST 1 VIEW COMPARISON:  Chest radiograph 09/11/2018 FINDINGS: The cardiomediastinal silhouette is within normal limits There is no focal consolidation or pulmonary edema. There is no pleural effusion or pneumothorax There is no acute osseous abnormality. IMPRESSION: No radiographic evidence of acute cardiopulmonary process. Electronically Signed   By: Valetta Mole M.D.   On: 04/30/2022 08:48   MM 3D SCREEN BREAST BILATERAL  Result Date: 04/16/2022 CLINICAL DATA:  Screening. EXAM: DIGITAL SCREENING BILATERAL MAMMOGRAM WITH TOMOSYNTHESIS AND CAD TECHNIQUE: Bilateral screening digital craniocaudal and mediolateral oblique  mammograms were obtained. Bilateral screening digital breast tomosynthesis was performed. The images were evaluated with computer-aided detection. COMPARISON:  Previous exam(s). ACR Breast Density Category c: The breast tissue is heterogeneously dense, which may obscure small masses. FINDINGS: There are no findings suspicious for malignancy. IMPRESSION: No mammographic evidence of malignancy. A result letter of this screening mammogram will be mailed directly to the patient. RECOMMENDATION: Screening mammogram in one year. (Code:SM-B-01Y) BI-RADS CATEGORY  1: Negative. Electronically Signed   By: Marin Olp M.D.   On: 04/16/2022 11:32     Assessment & Plan:   Excessive daytime sleepiness - Patient has symptoms of insomnia, excessive daytime sleepiness and restless sleep. She has a medical history significant for recurrent afib and depression. Concern patient could have underlying obstructive sleep apnea, needs sleep study to evaluate-recommending polysomnograph vs home sleep study d/t cardiac history. Discuss risk of untreated sleep apnea including cardiac arrhthymias, stroke, pulmonary HTN and DM. Reviewed treatment options including weight loss, oral appliance, CPAP or referral to ENT for possible surgical options. Advised against driving if experiencing excessive daytime sleepiness or fatigue.   GAD (generalized anxiety disorder) - Patient was recently changed from Amitriptyline 25mg  to Lexapro 5mg . She is experiencing some increased feelings of fatigue, anxiety/depression and generally not feeling well. Denies SI. Advised she reach out to primary care provider to discuss medication change.   Atrial fibrillation with RVR ([place]) - Hx recurrent afib, last hospitalized in October 2023. She is following with cardiology, seen on 05/12/22. EKG showed sinus rhythm. She is in regular rhythm on exam today. Cardiology is considering potential for ablation options vs daily antiarrhythmics. She is on asn needed  flecainide 100 mg, metoprolol 50 mg, and Eliquis 5 mg.   Benign essential HTN - Continue Diltiazem 180mg  daily    Martyn Ehrich, NP 05/15/2022

## 2022-05-15 NOTE — Assessment & Plan Note (Signed)
-   Patient has symptoms of insomnia, excessive daytime sleepiness and restless sleep. She has a medical history significant for recurrent afib and depression. Concern patient could have underlying obstructive sleep apnea, needs sleep study to evaluate-recommending polysomnograph vs home sleep study d/t cardiac history. Discuss risk of untreated sleep apnea including cardiac arrhthymias, stroke, pulmonary HTN and DM. Reviewed treatment options including weight loss, oral appliance, CPAP or referral to ENT for possible surgical options. Advised against driving if experiencing excessive daytime sleepiness or fatigue.

## 2022-05-15 NOTE — Assessment & Plan Note (Signed)
-   Hx recurrent afib, last hospitalized in October 2023. She is following with cardiology, seen on 05/12/22. EKG showed sinus rhythm. She is in regular rhythm on exam today. Cardiology is considering potential for ablation options vs daily antiarrhythmics. She is on asn needed flecainide 100 mg, metoprolol 50 mg, and Eliquis 5 mg.

## 2022-05-15 NOTE — Assessment & Plan Note (Signed)
-   Patient was recently changed from Amitriptyline 25mg  to Lexapro 5mg . She is experiencing some increased feelings of fatigue, anxiety/depression and generally not feeling well. Denies SI. Advised she reach out to primary care provider to discuss medication change.

## 2022-05-15 NOTE — Assessment & Plan Note (Signed)
-   Diltiazem 180 mg daily resumed 

## 2022-05-15 NOTE — Patient Instructions (Addendum)
Recommend you call PCP regarding antidepressants  Orders: Polysomnography re: daytime sleepiness/insomnia  Follow-up: Call after sleep study to scheduled follow-up (virtual ok)  Sleep Apnea Sleep apnea is a condition in which breathing pauses or becomes shallow during sleep. People with sleep apnea usually snore loudly. They may have times when they gasp and stop breathing for 10 seconds or more during sleep. This may happen many times during the night. Sleep apnea disrupts your sleep and keeps your body from getting the rest that it needs. This condition can increase your risk of certain health problems, including: Heart attack. Stroke. Obesity. Type 2 diabetes. Heart failure. Irregular heartbeat. High blood pressure. The goal of treatment is to help you breathe normally again. What are the causes?  The most common cause of sleep apnea is a collapsed or blocked airway. There are three kinds of sleep apnea: Obstructive sleep apnea. This kind is caused by a blocked or collapsed airway. Central sleep apnea. This kind happens when the part of the brain that controls breathing does not send the correct signals to the muscles that control breathing. Mixed sleep apnea. This is a combination of obstructive and central sleep apnea. What increases the risk? You are more likely to develop this condition if you: Are overweight. Smoke. Have a smaller than normal airway. Are older. Are female. Drink alcohol. Take sedatives or tranquilizers. Have a family history of sleep apnea. Have a tongue or tonsils that are larger than normal. What are the signs or symptoms? Symptoms of this condition include: Trouble staying asleep. Loud snoring. Morning headaches. Waking up gasping. Dry mouth or sore throat in the morning. Daytime sleepiness and tiredness. If you have daytime fatigue because of sleep apnea, you may be more likely to have: Trouble concentrating. Forgetfulness. Irritability or  mood swings. Personality changes. Feelings of depression. Sexual dysfunction. This may include loss of interest if you are female, or erectile dysfunction if you are female. How is this diagnosed? This condition may be diagnosed with: A medical history. A physical exam. A series of tests that are done while you are sleeping (sleep study). These tests are usually done in a sleep lab, but they may also be done at home. How is this treated? Treatment for this condition aims to restore normal breathing and to ease symptoms during sleep. It may involve managing health issues that can affect breathing, such as high blood pressure or obesity. Treatment may include: Sleeping on your side. Using a decongestant if you have nasal congestion. Avoiding the use of depressants, including alcohol, sedatives, and narcotics. Losing weight if you are overweight. Making changes to your diet. Quitting smoking. Using a device to open your airway while you sleep, such as: An oral appliance. This is a custom-made mouthpiece that shifts your lower jaw forward. A continuous positive airway pressure (CPAP) device. This device blows air through a mask when you breathe out (exhale). A nasal expiratory positive airway pressure (EPAP) device. This device has valves that you put into each nostril. A bi-level positive airway pressure (BIPAP) device. This device blows air through a mask when you breathe in (inhale) and breathe out (exhale). Having surgery if other treatments do not work. During surgery, excess tissue is removed to create a wider airway. Follow these instructions at home: Lifestyle Make any lifestyle changes that your health care provider recommends. Eat a healthy, well-balanced diet. Take steps to lose weight if you are overweight. Avoid using depressants, including alcohol, sedatives, and narcotics. Do not use any  products that contain nicotine or tobacco. These products include cigarettes, chewing  tobacco, and vaping devices, such as e-cigarettes. If you need help quitting, ask your health care provider. General instructions Take over-the-counter and prescription medicines only as told by your health care provider. If you were given a device to open your airway while you sleep, use it only as told by your health care provider. If you are having surgery, make sure to tell your health care provider you have sleep apnea. You may need to bring your device with you. Keep all follow-up visits. This is important. Contact a health care provider if: The device that you received to open your airway during sleep is uncomfortable or does not seem to be working. Your symptoms do not improve. Your symptoms get worse. Get help right away if: You develop: Chest pain. Shortness of breath. Discomfort in your back, arms, or stomach. You have: Trouble speaking. Weakness on one side of your body. Drooping in your face. These symptoms may represent a serious problem that is an emergency. Do not wait to see if the symptoms will go away. Get medical help right away. Call your local emergency services (911 in the [place] Do not drive yourself to the hospital. Summary Sleep apnea is a condition in which breathing pauses or becomes shallow during sleep. The most common cause is a collapsed or blocked airway. The goal of treatment is to restore normal breathing and to ease symptoms during sleep. This information is not intended to replace advice given to you by your health care provider. Make sure you discuss any questions you have with your health care provider. Document Revised: 01/29/2021 Document Reviewed: 05/31/2020 Elsevier Patient Education  2023 [employer].

## 2022-05-18 NOTE — Progress Notes (Signed)
Reviewed and agree with assessment/plan.   Toria Monte, MD [facility] 05/18/2022, 9:35 AM Pager:  [phone]  

## 2022-05-21 ENCOUNTER — Other Ambulatory Visit: Payer: Self-pay | Admitting: Medical

## 2022-05-24 ENCOUNTER — Other Ambulatory Visit: Payer: Self-pay | Admitting: Cardiovascular Disease

## 2022-06-03 ENCOUNTER — Ambulatory Visit: Payer: No Typology Code available for payment source | Attending: Otolaryngology

## 2022-06-03 DIAGNOSIS — R0683 Snoring: Secondary | ICD-10-CM | POA: Diagnosis not present

## 2022-06-03 DIAGNOSIS — R5383 Other fatigue: Secondary | ICD-10-CM | POA: Insufficient documentation

## 2022-06-09 NOTE — Progress Notes (Unsigned)
Electrophysiology Office Follow up Visit Note:    Date:  06/10/2022   ID:  Kelly [name], DOB 1980/11/18, MRN [id number]  PCP:  Dion Body, MD  [facility] Cardiologist:  Vickie Epley, MD  [facility] Electrophysiologist:  Vickie Epley, MD    Interval History:    Kelly [name] is a 41 y.o. female who presents for a follow up visit. They were last seen in clinic 06/04/2021 for pAF. When I first met the patient, her AF episodes were rare. She was recently hospitalized for recurrent AF on October 26. She saw Ignacia Bayley in follow up 05/12/2022. In the ER she was started on Dilt. At her appointment with CB, she discussed other rhythm control options including catheter ablation. She is taking Eliquis for stroke ppx.  Today she tells me she has a lot of anxiety surrounding when she will have another A-fib episode.  Her episode was highly symptomatic.  She was unable to drive and had her daughter drive her to the emergency department the day it happened.  She did a sleep study last week.  She describes feeling constantly exhausted and has interrupted sleep patterns.       Past Medical History:  Diagnosis Date   Anxiety    Demand ischemia    a. In setting of rapid afib-->09/2021 Cor CTA: Ca2+ = 0. Nl cors. Sm hiatal hernia.   Depression    History of echocardiogram    a. 08/2020 Echo: EF 55-60%, no rmwa, nl RV fxn.   Hypertension    Morbid obesity ([place])    PAF (paroxysmal atrial fibrillation) ([place])    a. 08/2020 -> CHA2DS2VASc = 2-->ASA only; b. 08/2021 Zio: Predominantly sinus rhythm with brief run of SVT and no sustained arrhythmias.  Triggered events associate with sinus rhythm; c.  04/2022 recurrent A-fib: As needed flecainide and eliquis Rx.   PONV (postoperative nausea and vomiting)    Sleep-disordered breathing     Past Surgical History:  Procedure Laterality Date   CESAREAN SECTION N/A    X2   CHOLECYSTECTOMY N/A 01/11/2015   Procedure:  LAPAROSCOPIC CHOLECYSTECTOMY WITH INTRAOPERATIVE CHOLANGIOGRAM;  Surgeon: Dia Crawford III, MD;  Location: [facility] ORS;  Service: General;  Laterality: N/A;   ESOPHAGOGASTRODUODENOSCOPY (EGD) WITH PROPOFOL N/A 01/15/2022   Procedure: ESOPHAGOGASTRODUODENOSCOPY (EGD) WITH PROPOFOL;  Surgeon: Lin Landsman, MD;  Location: [facility] ENDOSCOPY;  Service: Gastroenterology;  Laterality: N/A;    Current Medications: Current Meds  Medication Sig   ALAYCEN 1/35 tablet Take by mouth.   aspirin EC 81 MG EC tablet Take 1 tablet (81 mg total) by mouth daily. Swallow whole.   cyanocobalamin (VITAMIN B12) 1000 MCG/ML injection Inject into the muscle.   diltiazem (CARDIZEM CD) 180 MG 24 hr capsule TAKE 1 CAPSULE BY MOUTH EVERY DAY   ELIQUIS 5 MG TABS tablet Take 5 mg by mouth 2 (two) times daily.   escitalopram (LEXAPRO) 5 MG tablet Take 5 mg by mouth daily.   loratadine (CLARITIN) 10 MG tablet Take 10 mg by mouth at bedtime.   metoprolol tartrate (LOPRESSOR) 50 MG tablet Take 50 mg by mouth as needed (afib).   nitroGLYCERIN (NITROSTAT) 0.4 MG SL tablet Place 1 tablet (0.4 mg total) under the tongue every 5 (five) minutes as needed for chest pain.   omeprazole (PRILOSEC) 40 MG capsule TAKE 1 CAPSULE (40 MG TOTAL) BY MOUTH IN THE MORNING AND ONE CAPSULE AT BEDTIME.   [DISCONTINUED] flecainide (TAMBOCOR) 100 MG tablet TAKE 1 TABLET (  100 MG TOTAL) BY MOUTH 2 (TWO) TIMES DAILY AS NEEDED (FOR ATRIAL FIBRILLATION).   [DISCONTINUED] metoprolol tartrate (LOPRESSOR) 50 MG tablet Take 1 tablet (50 mg total) by mouth 2 (two) times daily.     Allergies:   Patient has no known allergies.   Social History   Socioeconomic History   Marital status: Married    Spouse name: Not on file   Number of children: Not on file   Years of education: Not on file   Highest education level: Not on file  Occupational History   Not on file  Tobacco Use   Smoking status: Never   Smokeless tobacco: Never  Vaping Use   Vaping Use:  Never used  Substance and Sexual Activity   Alcohol use: No   Drug use: No   Sexual activity: Yes  Other Topics Concern   Not on file  Social History Narrative   Not on file   Social Determinants of Health   Financial Resource Strain: Not on file  Food Insecurity: No Food Insecurity (04/30/2022)   Hunger Vital Sign    Worried About Running Out of Food in the Last Year: Never true    Ran Out of Food in the Last Year: Never true  Transportation Needs: No Transportation Needs (04/30/2022)   PRAPARE - Hydrologist (Medical): No    Lack of Transportation (Non-Medical): No  Physical Activity: Not on file  Stress: Not on file  Social Connections: Not on file     Family History: The patient's family history includes Diabetes in her mother; Heart disease in her father; Hyperlipidemia in her mother; Hypertension in her mother. There is no history of Breast cancer.  ROS:   Please see the history of present illness.    All other systems reviewed and are negative.  EKGs/Labs/Other Studies Reviewed:    The following studies were reviewed today:     Recent Labs: 09/10/2021: ALT 22 04/30/2022: Hemoglobin 16.7; Magnesium 2.3; Platelets 302; TSH 2.370 05/01/2022: BUN 11; Creatinine, Ser 0.65; Potassium 3.8; Sodium 141  Recent Lipid Panel No results found for: "CHOL", "TRIG", "HDL", "CHOLHDL", "VLDL", "LDLCALC", "LDLDIRECT"  Physical Exam:    VS:  BP (!) 150/90   Pulse 85   Ht _0  (1.778 m)   Wt 217 lb (98.4 kg)   SpO2 98%   BMI 31.14 kg/m     Wt Readings from Last 3 Encounters:  06/10/22 217 lb (98.4 kg)  05/15/22 217 lb 6.4 oz (98.6 kg)  05/12/22 222 lb 3.2 oz (100.8 kg)     GEN:  Well nourished, well developed in no acute distress HEENT: Normal NECK: No JVD; No carotid bruits LYMPHATICS: No lymphadenopathy CARDIAC: RRR, no murmurs, rubs, gallops RESPIRATORY:  Clear to auscultation without rales, wheezing or rhonchi  ABDOMEN: Soft,  non-tender, non-distended MUSCULOSKELETAL:  No edema; No deformity  SKIN: Warm and dry NEUROLOGIC:  Alert and oriented x 3 PSYCHIATRIC:  Normal affect        ASSESSMENT:    1. AF (paroxysmal atrial fibrillation) ([place])   2. Primary hypertension   3. Pre-procedure lab exam    PLAN:    In order of problems listed above:  #pAF Highly symptomatic paroxysms of atrial fibrillation.  She would like a more durable rhythm control strategy.  We discussed antiarrhythmic drugs and catheter ablation.  She is interested in pursuing catheter ablation which I think is very reasonable.  CHA2DS2-VASc Score = 2  The patient's  score is based upon: CHF History: 0 HTN History: 1 Diabetes History: 0 Stroke History: 0 Vascular Disease History: 0 Age Score: 0 Gender Score: 1   Discussed treatment options today for their AF including antiarrhythmic drug therapy and ablation. Discussed risks, recovery and likelihood of success. Discussed potential need for repeat ablation procedures and antiarrhythmic drugs after an initial ablation. They wish to proceed with scheduling.  Risk, benefits, and alternatives to EP study and radiofrequency ablation for afib were also discussed in detail today. These risks include but are not limited to stroke, bleeding, vascular damage, tamponade, perforation, damage to the esophagus, lungs, and other structures, pulmonary vein stenosis, worsening renal function, and death. The patient understands these risk and wishes to proceed.  We will therefore proceed with catheter ablation at the next available time.  Carto, ICE, anesthesia are requested for the procedure.  Will also obtain CT PV protocol prior to the procedure to exclude LAA thrombus and further evaluate atrial anatomy.   She will need to start her Eliquis 4 weeks prior to the scheduled procedure today.  We will take flecainide and metoprolol off her medication list.   Medication Adjustments/Labs and Tests  Ordered: Current medicines are reviewed at length with the patient today.  Concerns regarding medicines are outlined above.  Orders Placed This Encounter  Procedures   CBC   Basic metabolic panel   No orders of the defined types were placed in this encounter.    Signed, Lars Mage, MD, [facility], [facility] 06/10/2022 9:46 AM    Electrophysiology [facility]

## 2022-06-10 ENCOUNTER — Encounter: Payer: Self-pay | Admitting: *Deleted

## 2022-06-10 ENCOUNTER — Ambulatory Visit: Payer: No Typology Code available for payment source | Attending: Cardiology | Admitting: Cardiology

## 2022-06-10 ENCOUNTER — Encounter: Payer: Self-pay | Admitting: Cardiology

## 2022-06-10 ENCOUNTER — Ambulatory Visit: Payer: No Typology Code available for payment source | Admitting: Medical

## 2022-06-10 VITALS — BP 150/90 | HR 85 | Ht 70.0 in | Wt 217.0 lb

## 2022-06-10 DIAGNOSIS — Z01812 Encounter for preprocedural laboratory examination: Secondary | ICD-10-CM

## 2022-06-10 DIAGNOSIS — I10 Essential (primary) hypertension: Secondary | ICD-10-CM | POA: Diagnosis not present

## 2022-06-10 DIAGNOSIS — I480 Paroxysmal atrial fibrillation: Secondary | ICD-10-CM | POA: Diagnosis not present

## 2022-06-10 NOTE — Patient Instructions (Addendum)
Medication Instructions:  Your physician has recommended you make the following change in your medication:  START Eliquis 5 mg twice daily -- you will start this on 08/28/22  *If you need a refill on your cardiac medications before your next appointment, please call your pharmacy*   Lab Work: Pre procedure labs -- see procedure instruction letter:  BMP & CBC  If you have labs (blood work) drawn today and your tests are completely normal, you will receive your results only by: MyChart Message (if you have MyChart) OR A paper copy in the mail If you have any lab test that is abnormal or we need to change your treatment, we will call you to review the results.   Testing/Procedures: Your physician has recommended that you have an ablation. Catheter ablation is a medical procedure used to treat some cardiac arrhythmias (irregular heartbeats). During catheter ablation, a long, thin, flexible tube is put into a blood vessel in your groin (upper thigh), or neck. This tube is called an ablation catheter. It is then guided to your heart through the blood vessel. Radio frequency waves destroy small areas of heart tissue where abnormal heartbeats may cause an arrhythmia to start. Please follow instruction letter given to you today.   Follow-Up: At [facility], you and your health needs are our priority.  As part of our continuing mission to provide you with exceptional heart care, we have created designated Provider Care Teams.  These Care Teams include your primary Cardiologist (physician) and Advanced Practice Providers (APPs -  Physician Assistants and Nurse Practitioners) who all work together to provide you with the care you need, when you need it.   Your next appointment:   1 month(s) after your ablation  The format for your next appointment:   In Person  Provider:   AFib clinic   Thank you for choosing [facility]!!   Dory Horn, RN [phone]    Other  Instructions   Cardiac Ablation Cardiac ablation is a procedure to destroy (ablate) some heart tissue that is sending bad signals. These bad signals cause problems in heart rhythm. The heart has many areas that make these signals. If there are problems in these areas, they can make the heart beat in a way that is not normal. Destroying some tissues can help make the heart rhythm normal. Tell your doctor about: Any allergies you have. All medicines you are taking. These include vitamins, herbs, eye drops, creams, and over-the-counter medicines. Any problems you or family members have had with medicines that make you fall asleep (anesthetics). Any blood disorders you have. Any surgeries you have had. Any medical conditions you have, such as kidney failure. Whether you are pregnant or may be pregnant. What are the risks? This is a safe procedure. But problems may occur, including: Infection. Bruising and bleeding. Bleeding into the chest. Stroke or blood clots. Damage to nearby areas of your body. Allergies to medicines or dyes. The need for a pacemaker if the normal system is damaged. Failure of the procedure to treat the problem. What happens before the procedure? Medicines Ask your doctor about: Changing or stopping your normal medicines. This is important. Taking aspirin and ibuprofen. Do not take these medicines unless your doctor tells you to take them. Taking other medicines, vitamins, herbs, and supplements. General instructions Follow instructions from your doctor about what you cannot eat or drink. Plan to have someone take you home from the hospital or clinic. If you will be going home  right after the procedure, plan to have someone with you for 24 hours. Ask your doctor what steps will be taken to prevent infection. What happens during the procedure?  An IV tube will be put into one of your veins. You will be given a medicine to help you relax. The skin on your neck or  groin will be numbed. A cut (incision) will be made in your neck or groin. A needle will be put through your cut and into a large vein. A tube (catheter) will be put into the needle. The tube will be moved to your heart. Dye may be put through the tube. This helps your doctor see your heart. Small devices (electrodes) on the tube will send out signals. A type of energy will be used to destroy some heart tissue. The tube will be taken out. Pressure will be held on your cut. This helps stop bleeding. A bandage will be put over your cut. The exact procedure may vary among doctors and hospitals. What happens after the procedure? You will be watched until you leave the hospital or clinic. This includes checking your heart rate, breathing rate, oxygen, and blood pressure. Your cut will be watched for bleeding. You will need to lie still for a few hours. Do not drive for 24 hours or as long as your doctor tells you. Summary Cardiac ablation is a procedure to destroy some heart tissue. This is done to treat heart rhythm problems. Tell your doctor about any medical conditions you may have. Tell him or her about all medicines you are taking to treat them. This is a safe procedure. But problems may occur. These include infection, bruising, bleeding, and damage to nearby areas of your body. Follow what your doctor tells you about food and drink. You may also be told to change or stop some of your medicines. After the procedure, do not drive for 24 hours or as long as your doctor tells you. This information is not intended to replace advice given to you by your health care provider. Make sure you discuss any questions you have with your health care provider. Document Revised: 09/12/2021 Document Reviewed: 05/25/2019 Elsevier Patient Education  2023 [employer].

## 2022-06-19 ENCOUNTER — Encounter: Payer: No Typology Code available for payment source | Admitting: Primary Care

## 2022-06-19 ENCOUNTER — Telehealth: Payer: Self-pay | Admitting: Primary Care

## 2022-06-19 ENCOUNTER — Encounter: Payer: Self-pay | Admitting: Primary Care

## 2022-06-19 NOTE — Telephone Encounter (Signed)
Called and spoke with pt about info from [place]. Pt is requesting to have the sleep study results mailed to her for her to review and then she said she would give [place] a call back.

## 2022-06-19 NOTE — Progress Notes (Deleted)
@Patient  ID: , female    DOB: 1981-02-22, 41 y.o.   MRN: [id number]  Chief Complaint  Patient presents with   Follow-up    In lab sleep study     Referring provider: [id number], MD  HPI:   No Known Allergies  Immunization History  Administered Date(s) Administered   Influenza Split 04/25/2014   PFIZER Comirnaty([place])Covid-19 Tri-Sucrose Vaccine 03/03/2020, 04/06/2020   Tdap 05/08/2014    Past Medical History:  Diagnosis Date   Anxiety    Demand ischemia    a. In setting of rapid afib-->09/2021 Cor CTA: Ca2+ = 0. Nl cors. Sm hiatal hernia.   Depression    History of echocardiogram    a. 08/2020 Echo: EF 55-60%, no rmwa, nl RV fxn.   Hypertension    Morbid obesity ([facility])    PAF (paroxysmal atrial fibrillation) ([facility])    a. 08/2020 -> CHA2DS2VASc = 2-->ASA only; b. 08/2021 Zio: Predominantly sinus rhythm with brief run of SVT and no sustained arrhythmias.  Triggered events associate with sinus rhythm; c.  04/2022 recurrent A-fib: As needed flecainide and eliquis Rx.   PONV (postoperative nausea and vomiting)    Sleep-disordered breathing     Tobacco History: Social History   Tobacco Use  Smoking Status Never  Smokeless Tobacco Never   Counseling given: Not Answered   Outpatient Medications Prior to Visit  Medication Sig Dispense Refill   ALAYCEN 1/35 tablet Take by mouth.     aspirin EC 81 MG EC tablet Take 1 tablet (81 mg total) by mouth daily. Swallow whole. 30 tablet 0   cyanocobalamin (VITAMIN B12) 1000 MCG/ML injection Inject into the muscle.     diltiazem (CARDIZEM CD) 180 MG 24 hr capsule TAKE 1 CAPSULE BY MOUTH EVERY DAY 90 capsule 1   ELIQUIS 5 MG TABS tablet Take 5 mg by mouth 2 (two) times daily.     escitalopram (LEXAPRO) 5 MG tablet Take 5 mg by mouth daily.     loratadine (CLARITIN) 10 MG tablet Take 10 mg by mouth at bedtime.     metoprolol tartrate (LOPRESSOR) 50 MG tablet Take 50 mg by mouth as needed (afib).      nitroGLYCERIN (NITROSTAT) 0.4 MG SL tablet Place 1 tablet (0.4 mg total) under the tongue every 5 (five) minutes as needed for chest pain. 10 tablet 0   omeprazole (PRILOSEC) 40 MG capsule TAKE 1 CAPSULE (40 MG TOTAL) BY MOUTH IN THE MORNING AND ONE CAPSULE AT BEDTIME. 180 capsule 3   No facility-administered medications prior to visit.      Review of Systems  Review of Systems   Physical Exam  There were no vitals taken for this visit. Physical Exam   Lab Results:  CBC    Component Value Date/Time   WBC 10.1 04/30/2022 0739   RBC 5.51 (H) 04/30/2022 0739   HGB 16.7 (H) 04/30/2022 0739   HGB 12.0 07/23/2014 1354   HCT 48.8 (H) 04/30/2022 0739   HCT 36.9 07/23/2014 1354   PLT 302 04/30/2022 0739   PLT 173 07/23/2014 1354   MCV 88.6 04/30/2022 0739   MCV 96 07/23/2014 1354   MCH 30.3 04/30/2022 0739   MCHC 34.2 04/30/2022 0739   RDW 12.4 04/30/2022 0739   RDW 14.0 07/23/2014 1354   LYMPHSABS 3.0 08/26/2020 1113   LYMPHSABS 0.9 (L) 07/23/2014 1354   MONOABS 1.1 (H) 08/26/2020 1113   MONOABS 0.5 07/23/2014 1354   EOSABS 0.1 08/26/2020 1113  EOSABS 0.0 07/23/2014 1354   BASOSABS 0.0 08/26/2020 1113   BASOSABS 0.0 07/23/2014 1354    BMET    Component Value Date/Time   NA 141 05/01/2022 0456   NA 142 09/29/2021 0847   NA 142 07/23/2014 1354   K 3.8 05/01/2022 0456   K 3.6 07/23/2014 1354   CL 111 05/01/2022 0456   CL 109 (H) 07/23/2014 1354   CO2 21 (L) 05/01/2022 0456   CO2 24 07/23/2014 1354   GLUCOSE 95 05/01/2022 0456   GLUCOSE 148 (H) 07/23/2014 1354   BUN 11 05/01/2022 0456   BUN 8 09/29/2021 0847   BUN 6 (L) 07/23/2014 1354   CREATININE 0.65 05/01/2022 0456   CREATININE 0.62 07/23/2014 1354   CALCIUM 8.6 (L) 05/01/2022 0456   CALCIUM 8.4 (L) 07/23/2014 1354   GFRNONAA >60 05/01/2022 0456   GFRNONAA >60 07/23/2014 1354   GFRAA >60 01/11/2015 0315   GFRAA >60 07/23/2014 1354    BNP    Component Value Date/Time   BNP 92.7 08/26/2020 1136     ProBNP No results found for: "PROBNP"  Imaging: SLEEP STUDY DOCUMENTS  Result Date: 06/10/2022 Ordered by an unspecified provider.    Assessment & Plan:   No problem-specific Assessment & Plan notes found for this encounter.     Glenford Bayley, NP 06/19/2022

## 2022-06-19 NOTE — Telephone Encounter (Signed)
Please let patient know that I received sleep study from [facility] that was done on 06/03/2022.  Sleep study did not demonstrate significant obstructive sleep apnea, overall AHI was 0.2 events an hour.  SpO2 low was 92%.  She did not meet diagnostic criteria for obstructive sleep apnea.  She had some fragmented sleep due to snoring.  We can refer her to orthodontics to be fitted for an oral appliance to help with snoring.  She had some periodic limb movements which can be associated with restless leg syndrome.  If this symptom is particularly bothersome to her recommend checking ferritin level  and we could trial a medication called Requip.  Medication such as Lexapro can contribute to insomnia as well.  She may want to discuss changing antidepressants with original prescriber.  If she would like to discuss sleep studies further can you reschedul virtual visit with me, she had a visit this morning that was canceled because we did not have sleep study results at that time to review.

## 2022-09-12 ENCOUNTER — Encounter: Payer: Self-pay | Admitting: Cardiology

## 2022-09-12 DIAGNOSIS — I480 Paroxysmal atrial fibrillation: Secondary | ICD-10-CM

## 2022-09-14 ENCOUNTER — Other Ambulatory Visit
Admission: RE | Admit: 2022-09-14 | Discharge: 2022-09-14 | Disposition: A | Discharge status: Home / Self Care | Payer: No Typology Code available for payment source | Source: Ambulatory Visit | Attending: Cardiology | Admitting: Cardiology

## 2022-09-14 DIAGNOSIS — Z01812 Encounter for preprocedural laboratory examination: Secondary | ICD-10-CM | POA: Diagnosis present

## 2022-09-14 DIAGNOSIS — I480 Paroxysmal atrial fibrillation: Secondary | ICD-10-CM | POA: Insufficient documentation

## 2022-09-14 LAB — BASIC METABOLIC PANEL
Anion gap: 8 (ref 5–15)
BUN: 10 mg/dL (ref 6–20)
CO2: 20 mmol/L — ABNORMAL LOW (ref 22–32)
Calcium: 8.6 mg/dL — ABNORMAL LOW (ref 8.9–10.3)
Chloride: 107 mmol/L (ref 98–111)
Creatinine, Ser: 0.67 mg/dL (ref 0.44–1.00)
GFR, Estimated: >60 mL/min (ref >60)
Glucose, Bld: 98 mg/dL (ref 70–99)
Potassium: 3.4 mmol/L — ABNORMAL LOW (ref 3.5–5.1)
Sodium: 135 mmol/L (ref 135–145)

## 2022-09-14 LAB — CBC
HCT: 39.7 % (ref 36.0–46.0)
Hemoglobin: 13.5 g/dL (ref 12.0–15.0)
MCH: 30.1 pg (ref 26.0–34.0)
MCHC: 34 g/dL (ref 30.0–36.0)
MCV: 88.4 fL (ref 80.0–100.0)
Platelets: 243 10*3/uL (ref 150–400)
RBC: 4.49 MIL/uL (ref 3.87–5.11)
RDW: 12.3 % (ref 11.5–15.5)
WBC: 8.4 10*3/uL (ref 4.0–10.5)
nRBC: 0 % (ref 0.0–0.2)

## 2022-09-14 MED ORDER — ELIQUIS 5 MG PO TABS: 5.0000 mg | ORAL_TABLET | Freq: Two times a day (BID) | ORAL | 5 refills | Status: DC

## 2022-09-14 NOTE — Telephone Encounter (Signed)
Prescription refill request for Eliquis received. Indication: Afib  Last office visit: 06/10/22 Quentin Ore)  Scr: 0.65 (05/01/22)  Age: 42 Weight: 98.4kg  Appropriate dose. Refill sent.

## 2022-09-17 ENCOUNTER — Telehealth: Payer: Self-pay

## 2022-09-17 DIAGNOSIS — E876 Hypokalemia: Secondary | ICD-10-CM

## 2022-09-17 MED ORDER — POTASSIUM CHLORIDE CRYS ER 20 MEQ PO TBCR: 20.0000 meq | EXTENDED_RELEASE_TABLET | Freq: Every day | ORAL | 3 refills | Status: DC

## 2022-09-17 NOTE — Telephone Encounter (Signed)
Reviewed results with DOD Dr. Lovena Le advised labs drawn pre op Afib Ablation scheduled for 09/25/22.  Recommends pt take Potassium chloride 20 mEq PO QD repeat labs within a week. Called pt advised of low potassium level of 3.4.  Pt will start KCL 20 mEq PO QD will have repeat labs at [facility] in [place] on 09/23/22.  Orders placed.  Will route to Primary RN as an Pharmacist, hospital.

## 2022-09-23 ENCOUNTER — Other Ambulatory Visit
Admission: RE | Admit: 2022-09-23 | Discharge: 2022-09-23 | Disposition: A | Discharge status: Home / Self Care | Payer: No Typology Code available for payment source | Attending: Cardiology | Admitting: Cardiology

## 2022-09-23 DIAGNOSIS — E876 Hypokalemia: Secondary | ICD-10-CM

## 2022-09-23 LAB — BASIC METABOLIC PANEL
Anion gap: 5 (ref 5–15)
BUN: 10 mg/dL (ref 6–20)
CO2: 23 mmol/L (ref 22–32)
Calcium: 8.7 mg/dL — ABNORMAL LOW (ref 8.9–10.3)
Chloride: 110 mmol/L (ref 98–111)
Creatinine, Ser: 0.71 mg/dL (ref 0.44–1.00)
GFR, Estimated: >60 mL/min (ref >60)
Glucose, Bld: 104 mg/dL — ABNORMAL HIGH (ref 70–99)
Potassium: 3.8 mmol/L (ref 3.5–5.1)
Sodium: 138 mmol/L (ref 135–145)

## 2022-09-24 NOTE — Anesthesia Preprocedure Evaluation (Addendum)
Anesthesia Evaluation  Patient identified by MRN, date of birth, ID band Patient awake    Reviewed: Allergy & Precautions, NPO status , Patient's Chart, lab work & pertinent test results  History of Anesthesia Complications (+) PONV and history of anesthetic complications  Airway Mallampati: II  TM Distance: >3 FB Neck ROM: Full    Dental  (+) Teeth Intact, Dental Advisory Given   Pulmonary neg pulmonary ROS   Pulmonary exam normal breath sounds clear to auscultation       Cardiovascular hypertension, Pt. on medications and Pt. on home beta blockers Normal cardiovascular exam+ dysrhythmias Atrial Fibrillation  Rhythm:Regular Rate:Normal     Neuro/Psych  PSYCHIATRIC DISORDERS Anxiety Depression    negative neurological ROS     GI/Hepatic Neg liver ROS,GERD  Medicated,,  Endo/Other    Morbid obesity  Renal/GU negative Renal ROS     Musculoskeletal negative musculoskeletal ROS (+)    Abdominal   Peds  Hematology  (+) Blood dyscrasia (Eliquis)   Anesthesia Other Findings   Reproductive/Obstetrics negative OB ROS                             Anesthesia Physical Anesthesia Plan  ASA: 3  Anesthesia Plan: General   Post-op Pain Management: Tylenol PO (pre-op)*   Induction: Intravenous  PONV Risk Score and Plan: 4 or greater and Midazolam, Propofol infusion, Dexamethasone and Ondansetron  Airway Management Planned: Oral ETT  Additional Equipment:   Intra-op Plan:   Post-operative Plan: Extubation in OR  Informed Consent: I have reviewed the patients History and Physical, chart, labs and discussed the procedure including the risks, benefits and alternatives for the proposed anesthesia with the patient or authorized representative who has indicated his/her understanding and acceptance.     Dental advisory given  Plan Discussed with: CRNA  Anesthesia Plan Comments:          Anesthesia Quick Evaluation

## 2022-09-24 NOTE — Pre-Procedure Instructions (Signed)
Attempted to call patient regarding procedure instructions.   Left voice mail on the following items: Arrival time 0515 Nothing to eat or drink after midnight No meds AM of procedure Responsible person to drive you home and stay with you for 24 hrs  Have you missed any doses of anti-coagulant Eliquis- if you have missed any doses please let office know right away, take both doses today, don't take any in the morning.

## 2022-09-25 ENCOUNTER — Ambulatory Visit (HOSPITAL_BASED_OUTPATIENT_CLINIC_OR_DEPARTMENT_OTHER): Payer: No Typology Code available for payment source | Admitting: Anesthesiology

## 2022-09-25 ENCOUNTER — Ambulatory Visit (HOSPITAL_COMMUNITY)
Admission: RE | Admit: 2022-09-25 | Discharge: 2022-09-25 | Disposition: A | Discharge status: Home / Self Care | Payer: No Typology Code available for payment source | Attending: Cardiology | Admitting: Cardiology

## 2022-09-25 ENCOUNTER — Other Ambulatory Visit: Payer: Self-pay

## 2022-09-25 ENCOUNTER — Encounter (HOSPITAL_COMMUNITY)
Admission: RE | Disposition: A | Discharge status: Home / Self Care | Payer: Self-pay | Source: Home / Self Care | Attending: Cardiology

## 2022-09-25 ENCOUNTER — Ambulatory Visit (HOSPITAL_COMMUNITY): Payer: No Typology Code available for payment source | Admitting: Anesthesiology

## 2022-09-25 ENCOUNTER — Other Ambulatory Visit (HOSPITAL_COMMUNITY): Payer: Self-pay

## 2022-09-25 DIAGNOSIS — Z7901 Long term (current) use of anticoagulants: Secondary | ICD-10-CM | POA: Insufficient documentation

## 2022-09-25 DIAGNOSIS — F418 Other specified anxiety disorders: Secondary | ICD-10-CM | POA: Diagnosis not present

## 2022-09-25 DIAGNOSIS — I480 Paroxysmal atrial fibrillation: Secondary | ICD-10-CM | POA: Diagnosis present

## 2022-09-25 DIAGNOSIS — F419 Anxiety disorder, unspecified: Secondary | ICD-10-CM | POA: Diagnosis not present

## 2022-09-25 DIAGNOSIS — I4891 Unspecified atrial fibrillation: Secondary | ICD-10-CM

## 2022-09-25 DIAGNOSIS — I10 Essential (primary) hypertension: Secondary | ICD-10-CM | POA: Diagnosis not present

## 2022-09-25 DIAGNOSIS — Z6830 Body mass index (BMI) 30.0-30.9, adult: Secondary | ICD-10-CM | POA: Insufficient documentation

## 2022-09-25 HISTORY — PX: ATRIAL FIBRILLATION ABLATION: EP1191

## 2022-09-25 LAB — PREGNANCY, URINE: Preg Test, Ur: NEGATIVE

## 2022-09-25 SURGERY — ATRIAL FIBRILLATION ABLATION
Anesthesia: General

## 2022-09-25 MED ORDER — ACETAMINOPHEN 500 MG PO TABS
1000.0000 mg | ORAL_TABLET | Freq: Once | ORAL | Status: AC
  Administered 2022-09-25: 1000 mg via ORAL
  Filled 2022-09-25: qty 2

## 2022-09-25 MED ORDER — SODIUM CHLORIDE 0.9 % IV SOLN: INTRAVENOUS | Status: DC

## 2022-09-25 MED ORDER — SODIUM CHLORIDE 0.9% FLUSH: 3.0000 mL | INTRAVENOUS | Status: DC | PRN

## 2022-09-25 MED ORDER — PROTAMINE SULFATE 10 MG/ML IV SOLN
INTRAVENOUS | Status: DC | PRN
  Administered 2022-09-25: 30 mg via INTRAVENOUS

## 2022-09-25 MED ORDER — PANTOPRAZOLE SODIUM 40 MG PO TBEC
40.0000 mg | DELAYED_RELEASE_TABLET | Freq: Every day | ORAL | Status: DC
  Administered 2022-09-25: 40 mg via ORAL
  Filled 2022-09-25: qty 1

## 2022-09-25 MED ORDER — FENTANYL CITRATE (PF) 100 MCG/2ML IJ SOLN: 25.0000 ug | INTRAMUSCULAR | Status: DC | PRN

## 2022-09-25 MED ORDER — PROPOFOL 10 MG/ML IV BOLUS
INTRAVENOUS | Status: DC | PRN
  Administered 2022-09-25: 150 mg via INTRAVENOUS

## 2022-09-25 MED ORDER — HEPARIN (PORCINE) IN NACL 1000-0.9 UT/500ML-% IV SOLN
INTRAVENOUS | Status: DC | PRN
  Administered 2022-09-25 (×3): 500 mL

## 2022-09-25 MED ORDER — ACETAMINOPHEN 325 MG PO TABS: 650.0000 mg | ORAL_TABLET | ORAL | Status: DC | PRN

## 2022-09-25 MED ORDER — COLCHICINE 0.6 MG PO TABS
0.6000 mg | ORAL_TABLET | Freq: Two times a day (BID) | ORAL | 0 refills | Status: DC
  Filled 2022-09-25: qty 10, 5d supply, fill #0

## 2022-09-25 MED ORDER — DEXAMETHASONE SODIUM PHOSPHATE 10 MG/ML IJ SOLN
INTRAMUSCULAR | Status: DC | PRN
  Administered 2022-09-25: 5 mg via INTRAVENOUS

## 2022-09-25 MED ORDER — ROCURONIUM BROMIDE 10 MG/ML (PF) SYRINGE
PREFILLED_SYRINGE | INTRAVENOUS | Status: DC | PRN
  Administered 2022-09-25: 70 mg via INTRAVENOUS

## 2022-09-25 MED ORDER — ONDANSETRON HCL 4 MG/2ML IJ SOLN
INTRAMUSCULAR | Status: DC | PRN
  Administered 2022-09-25: 4 mg via INTRAVENOUS

## 2022-09-25 MED ORDER — MIDAZOLAM HCL 2 MG/2ML IJ SOLN
INTRAMUSCULAR | Status: DC | PRN
  Administered 2022-09-25: 2 mg via INTRAVENOUS

## 2022-09-25 MED ORDER — COLCHICINE 0.6 MG PO TABS
0.6000 mg | ORAL_TABLET | Freq: Two times a day (BID) | ORAL | Status: DC
  Administered 2022-09-25: 0.6 mg via ORAL
  Filled 2022-09-25: qty 1

## 2022-09-25 MED ORDER — APIXABAN 5 MG PO TABS
5.0000 mg | ORAL_TABLET | Freq: Two times a day (BID) | ORAL | Status: DC
  Administered 2022-09-25: 5 mg via ORAL
  Filled 2022-09-25: qty 1

## 2022-09-25 MED ORDER — PROMETHAZINE HCL 25 MG/ML IJ SOLN
6.2500 mg | INTRAMUSCULAR | Status: DC | PRN
  Filled 2022-09-25: qty 1

## 2022-09-25 MED ORDER — PANTOPRAZOLE SODIUM 40 MG PO TBEC
40.0000 mg | DELAYED_RELEASE_TABLET | Freq: Every day | ORAL | 0 refills | Status: DC
  Filled 2022-09-25: qty 30, 30d supply, fill #0
  Filled 2022-09-25: qty 45, 45d supply, fill #0

## 2022-09-25 MED ORDER — HEPARIN SODIUM (PORCINE) 1000 UNIT/ML IJ SOLN
INTRAMUSCULAR | Status: DC | PRN
  Administered 2022-09-25 (×2): 4000 [IU] via INTRAVENOUS
  Administered 2022-09-25: 15000 [IU] via INTRAVENOUS

## 2022-09-25 MED ORDER — SODIUM CHLORIDE 0.9% FLUSH: 3.0000 mL | Freq: Two times a day (BID) | INTRAVENOUS | Status: DC

## 2022-09-25 MED ORDER — SODIUM CHLORIDE 0.9 % IV SOLN: 250.0000 mL | INTRAVENOUS | Status: DC | PRN

## 2022-09-25 MED ORDER — HEPARIN SODIUM (PORCINE) 1000 UNIT/ML IJ SOLN
INTRAMUSCULAR | Status: DC | PRN
  Administered 2022-09-25: 1000 [IU] via INTRAVENOUS

## 2022-09-25 MED ORDER — ONDANSETRON HCL 4 MG/2ML IJ SOLN: 4.0000 mg | Freq: Four times a day (QID) | INTRAMUSCULAR | Status: DC | PRN

## 2022-09-25 MED ORDER — HEPARIN SODIUM (PORCINE) 1000 UNIT/ML IJ SOLN
INTRAMUSCULAR | Status: AC
  Filled 2022-09-25: qty 10

## 2022-09-25 MED ORDER — SODIUM CHLORIDE 0.9 % IV SOLN
25.0000 mg | INTRAVENOUS | Status: DC
  Filled 2022-09-25: qty 1

## 2022-09-25 MED ORDER — SODIUM CHLORIDE 0.9 % IV SOLN
12.5000 mg | INTRAVENOUS | Status: DC
  Filled 2022-09-25: qty 0.5

## 2022-09-25 MED ORDER — FENTANYL CITRATE (PF) 250 MCG/5ML IJ SOLN
INTRAMUSCULAR | Status: DC | PRN
  Administered 2022-09-25: 50 ug via INTRAVENOUS
  Administered 2022-09-25: 100 ug via INTRAVENOUS
  Administered 2022-09-25: 50 ug via INTRAVENOUS

## 2022-09-25 SURGICAL SUPPLY — 20 items
BAG SNAP BAND KOVER 36X36 (MISCELLANEOUS) IMPLANT
CATH 8FR REPROCESSED SOUNDSTAR (CATHETERS) IMPLANT
CATH 8FR SOUNDSTAR REPROCESSED (CATHETERS) IMPLANT
CATH ABLAT QDOT MICRO BI TC FJ (CATHETERS) IMPLANT
CATH GE 8FR SOUNDSTAR (CATHETERS) IMPLANT
CATH OCTARAY 2.0 F 3-3-3-3-3 (CATHETERS) IMPLANT
CATH S-M CIRCA TEMP PROBE (CATHETERS) IMPLANT
CATH WEBSTER BI DIR CS D-F CRV (CATHETERS) IMPLANT
CLOSURE PERCLOSE PROSTYLE (VASCULAR PRODUCTS) IMPLANT
COVER SWIFTLINK CONNECTOR (BAG) ×1 IMPLANT
PACK EP LATEX FREE (CUSTOM PROCEDURE TRAY) ×1
PACK EP LF (CUSTOM PROCEDURE TRAY) ×1 IMPLANT
PAD DEFIB RADIO PHYSIO CONN (PAD) ×1 IMPLANT
PATCH CARTO3 (PAD) IMPLANT
SHEATH BAYLIS TRANSSEPTAL 98CM (NEEDLE) IMPLANT
SHEATH CARTO VIZIGO SM CVD (SHEATH) IMPLANT
SHEATH PINNACLE 8F 10CM (SHEATH) IMPLANT
SHEATH PINNACLE 9F 10CM (SHEATH) IMPLANT
SHEATH PROBE COVER 6X72 (BAG) IMPLANT
TUBING SMART ABLATE COOLFLOW (TUBING) IMPLANT

## 2022-09-25 NOTE — Anesthesia Postprocedure Evaluation (Signed)
Anesthesia Post Note  Patient: Kelly [name]  Procedure(s) Performed: ATRIAL FIBRILLATION ABLATION     Patient location during evaluation: PACU Anesthesia Type: General Level of consciousness: awake and alert Pain management: pain level controlled Vital Signs Assessment: post-procedure vital signs reviewed and stable Respiratory status: spontaneous breathing, nonlabored ventilation, respiratory function stable and patient connected to nasal cannula oxygen Cardiovascular status: blood pressure returned to baseline and stable Postop Assessment: no apparent nausea or vomiting Anesthetic complications: no   There were no known notable events for this encounter.  Last Vitals:  Vitals:   09/25/22 1330 09/25/22 1400  BP: 101/66 109/65  Pulse: 72 77  Resp: 12 16  Temp:    SpO2: 96% 97%    Last Pain:  Vitals:   09/25/22 1145  TempSrc:   PainSc: 0-No pain                 Santa Lighter

## 2022-09-25 NOTE — Anesthesia Procedure Notes (Signed)
Procedure Name: Intubation Date/Time: 09/25/2022 7:46 AM  Performed by: Elvin So, CRNAPre-anesthesia Checklist: Patient identified, Emergency Drugs available, Suction available and Patient being monitored Patient Re-evaluated:Patient Re-evaluated prior to induction Oxygen Delivery Method: Circle System Utilized Preoxygenation: Pre-oxygenation with 100% oxygen Induction Type: IV induction Ventilation: Mask ventilation without difficulty Laryngoscope Size: Mac and 3 Grade View: Grade I Tube type: Oral Tube size: 7.0 mm Number of attempts: 1 Airway Equipment and Method: Stylet and Oral airway Placement Confirmation: ETT inserted through vocal cords under direct vision, positive ETCO2 and breath sounds checked- equal and bilateral Secured at: 21 cm Tube secured with: Tape Dental Injury: Teeth and Oropharynx as per pre-operative assessment

## 2022-09-25 NOTE — Transfer of Care (Signed)
Immediate Anesthesia Transfer of Care Note  Patient: Kelly [name]  Procedure(s) Performed: ATRIAL FIBRILLATION ABLATION  Patient Location: PACU  Anesthesia Type:General  Level of Consciousness: awake and patient cooperative  Airway & Oxygen Therapy: Patient Spontanous Breathing and Patient connected to nasal cannula oxygen  Post-op Assessment: Report given to RN, Post -op Vital signs reviewed and stable, and Patient moving all extremities  Post vital signs: Reviewed and stable  Last Vitals:  Vitals Value Taken Time  BP 115/65 09/25/22 1017  Temp 36.8 C 09/25/22 1017  Pulse 77 09/25/22 1024  Resp 17 09/25/22 1024  SpO2 95 % 09/25/22 1024  Vitals shown include unvalidated device data.  Last Pain:  Vitals:   09/25/22 1017  TempSrc: Temporal  PainSc: Asleep         Complications: There were no known notable events for this encounter.

## 2022-09-25 NOTE — Discharge Instructions (Addendum)
Post procedure care instructions No driving for 4 days. No lifting over 5 lbs for 1 week. No vigorous or sexual activity for 1 week. You may return to work/your usual activities on 10/03/22. Keep procedure site clean & dry. If you notice increased pain, swelling, bleeding or pus, call/return!  You may shower after 24 hours, but no soaking in baths/hot tubs/pools for 1 week.    You have an appointment set up with the [facility].  Multiple studies have shown that being followed by a dedicated atrial fibrillation clinic in addition to the standard care you receive from your other physicians improves health. We believe that enrollment in the atrial fibrillation clinic will allow [place] to better care for you.   The phone number to the [facility] is [phone]. The clinic is staffed Monday through Friday from 8:30am to 5pm.  Directions: The clinic is located in the [facility], [place] the hospital at the MAIN ENTRANCE "A", use [address] to the 6th floor.  Registration desk to the right of elevators on 6th floor  If you have any trouble locating the clinic, please don't hesitate to call [phone]. Cardiac Ablation, Care After  This sheet gives you information about how to care for yourself after your procedure. Your health care provider may also give you more specific instructions. If you have problems or questions, contact your health care provider. What can I expect after the procedure? After the procedure, it is common to have: Bruising around your puncture site. Tenderness around your puncture site. Skipped heartbeats. If you had an atrial fibrillation ablation, you may have atrial fibrillation during the first several months after your procedure.  Tiredness (fatigue).  Follow these instructions at home: Puncture site care  Follow instructions from your health care provider about how to take care of your puncture site. Make sure  you: If present, leave stitches (sutures), skin glue, or adhesive strips in place. These skin closures may need to stay in place for up to 2 weeks. If adhesive strip edges start to loosen and curl up, you may trim the loose edges. Do not remove adhesive strips completely unless your health care provider tells you to do that. If a large square bandage is present, this may be removed 24 hours after surgery.  Check your puncture site every day for signs of infection. Check for: Redness, swelling, or pain. Fluid or blood. If your puncture site starts to bleed, lie down on your back, apply firm pressure to the area, and contact your health care provider. Warmth. Pus or a bad smell. A pea or small marble sized lump at the site is normal and can take up to three months to resolve.  Driving Do not drive for at least 4 days after your procedure or however long your health care provider recommends. (Do not resume driving if you have previously been instructed not to drive for other health reasons.) Do not drive or use heavy machinery while taking prescription pain medicine. Activity Avoid activities that take a lot of effort for at least 7 days after your procedure. Do not lift anything that is heavier than 5 lb (4.5 kg) for one week.  No sexual activity for 1 week.  Return to your normal activities as told by your health care provider. Ask your health care provider what activities are safe for you. General instructions Take over-the-counter and prescription medicines only as told by your health care provider. Do not use any  products that contain nicotine or tobacco, such as cigarettes and e-cigarettes. If you need help quitting, ask your health care provider. You may shower after 24 hours, but Do not take baths, swim, or use a hot tub for 1 week.  Do not drink alcohol for 24 hours after your procedure. Keep all follow-up visits as told by your health care provider. This is important. Contact a health  care provider if: You have redness, mild swelling, or pain around your puncture site. You have fluid or blood coming from your puncture site that stops after applying firm pressure to the area. Your puncture site feels warm to the touch. You have pus or a bad smell coming from your puncture site. You have a fever. You have chest pain or discomfort that spreads to your neck, jaw, or arm. You have chest pain that is worse with lying on your back or taking a deep breath. You are sweating a lot. You feel nauseous. You have a fast or irregular heartbeat. You have shortness of breath. You are dizzy or light-headed and feel the need to lie down. You have pain or numbness in the arm or leg closest to your puncture site. Get help right away if: Your puncture site suddenly swells. Your puncture site is bleeding and the bleeding does not stop after applying firm pressure to the area. These symptoms may represent a serious problem that is an emergency. Do not wait to see if the symptoms will go away. Get medical help right away. Call your local emergency services (911 in the [place] Do not drive yourself to the hospital. Summary After the procedure, it is normal to have bruising and tenderness at the puncture site in your groin, neck, or forearm. Check your puncture site every day for signs of infection. Get help right away if your puncture site is bleeding and the bleeding does not stop after applying firm pressure to the area. This is a medical emergency. This information is not intended to replace advice given to you by your health care provider. Make sure you discuss any questions you have with your health care provider.

## 2022-09-25 NOTE — H&P (Signed)
Electrophysiology Office Follow up Visit Note:     Date:  09/25/2022    ID:  Kelly [name] [place], DOB 1980/09/19, MRN [id number]   PCP:  Dion Body, MD          [facility] Cardiologist:  Vickie Epley, MD  [facility] Electrophysiologist:  Vickie Epley, MD      Interval History:     Kelly [name] is a 42 y.o. female who presents for a follow up visit. They were last seen in clinic 06/04/2021 for pAF. When I first met the patient, her AF episodes were rare. She was recently hospitalized for recurrent AF on October 26. She saw Ignacia Bayley in follow up 05/12/2022. In the ER she was started on Dilt. At her appointment with CB, she discussed other rhythm control options including catheter ablation. She is taking Eliquis for stroke ppx.   Today she tells me she has a lot of anxiety surrounding when she will have another A-fib episode.  Her episode was highly symptomatic.  She was unable to drive and had her daughter drive her to the emergency department the day it happened.  She did a sleep study last week.  She describes feeling constantly exhausted and has interrupted sleep patterns.   Presents for PVI.        Objective      Past Medical History:  Diagnosis Date   Anxiety     Demand ischemia      a. In setting of rapid afib-->09/2021 Cor CTA: Ca2+ = 0. Nl cors. Sm hiatal hernia.   Depression     History of echocardiogram      a. 08/2020 Echo: EF 55-60%, no rmwa, nl RV fxn.   Hypertension     Morbid obesity ([place])     PAF (paroxysmal atrial fibrillation) ([place])      a. 08/2020 -> CHA2DS2VASc = 2-->ASA only; b. 08/2021 Zio: Predominantly sinus rhythm with brief run of SVT and no sustained arrhythmias.  Triggered events associate with sinus rhythm; c.  04/2022 recurrent A-fib: As needed flecainide and eliquis Rx.   PONV (postoperative nausea and vomiting)     Sleep-disordered breathing             Past Surgical History:  Procedure Laterality Date   CESAREAN  SECTION N/A      X2   CHOLECYSTECTOMY N/A 01/11/2015    Procedure: LAPAROSCOPIC CHOLECYSTECTOMY WITH INTRAOPERATIVE CHOLANGIOGRAM;  Surgeon: Dia Crawford III, MD;  Location: [facility] ORS;  Service: General;  Laterality: N/A;   ESOPHAGOGASTRODUODENOSCOPY (EGD) WITH PROPOFOL N/A 01/15/2022    Procedure: ESOPHAGOGASTRODUODENOSCOPY (EGD) WITH PROPOFOL;  Surgeon: Lin Landsman, MD;  Location: [facility] ENDOSCOPY;  Service: Gastroenterology;  Laterality: N/A;      Current Medications: Active Medications      Current Meds  Medication Sig   ALAYCEN 1/35 tablet Take by mouth.   aspirin EC 81 MG EC tablet Take 1 tablet (81 mg total) by mouth daily. Swallow whole.   cyanocobalamin (VITAMIN B12) 1000 MCG/ML injection Inject into the muscle.   diltiazem (CARDIZEM CD) 180 MG 24 hr capsule TAKE 1 CAPSULE BY MOUTH EVERY DAY   ELIQUIS 5 MG TABS tablet Take 5 mg by mouth 2 (two) times daily.   escitalopram (LEXAPRO) 5 MG tablet Take 5 mg by mouth daily.   loratadine (CLARITIN) 10 MG tablet Take 10 mg by mouth at bedtime.   metoprolol tartrate (LOPRESSOR) 50 MG tablet Take 50 mg by mouth as needed (afib).  nitroGLYCERIN (NITROSTAT) 0.4 MG SL tablet Place 1 tablet (0.4 mg total) under the tongue every 5 (five) minutes as needed for chest pain.   omeprazole (PRILOSEC) 40 MG capsule TAKE 1 CAPSULE (40 MG TOTAL) BY MOUTH IN THE MORNING AND ONE CAPSULE AT BEDTIME.   [DISCONTINUED] flecainide (TAMBOCOR) 100 MG tablet TAKE 1 TABLET (100 MG TOTAL) BY MOUTH 2 (TWO) TIMES DAILY AS NEEDED (FOR ATRIAL FIBRILLATION).   [DISCONTINUED] metoprolol tartrate (LOPRESSOR) 50 MG tablet Take 1 tablet (50 mg total) by mouth 2 (two) times daily.        Allergies:   Patient has no known allergies.    Social History         Socioeconomic History   Marital status: Married      Spouse name: Not on file   Number of children: Not on file   Years of education: Not on file   Highest education level: Not on file  Occupational History    Not on file  Tobacco Use   Smoking status: Never   Smokeless tobacco: Never  Vaping Use   Vaping Use: Never used  Substance and Sexual Activity   Alcohol use: No   Drug use: No   Sexual activity: Yes  Other Topics Concern   Not on file  Social History Narrative   Not on file    Social Determinants of Health        Financial Resource Strain: Not on file  Food Insecurity: No Food Insecurity (04/30/2022)    Hunger Vital Sign     Worried About Running Out of Food in the Last Year: Never true     Ran Out of Food in the Last Year: Never true  Transportation Needs: No Transportation Needs (04/30/2022)    PRAPARE - Armed forces logistics/support/administrative officer (Medical): No     Lack of Transportation (Non-Medical): No  Physical Activity: Not on file  Stress: Not on file  Social Connections: Not on file      Family History: The patient's family history includes Diabetes in her mother; Heart disease in her father; Hyperlipidemia in her mother; Hypertension in her mother. There is no history of Breast cancer.   ROS:   Please see the history of present illness.    All other systems reviewed and are negative.   EKGs/Labs/Other Studies Reviewed:     The following studies were reviewed today:         Recent Labs: 09/10/2021: ALT 22 04/30/2022: Hemoglobin 16.7; Magnesium 2.3; Platelets 302; TSH 2.370 05/01/2022: BUN 11; Creatinine, Ser 0.65; Potassium 3.8; Sodium 141  Recent Lipid Panel Labs (Brief)  No results found for: "CHOL", "TRIG", "HDL", "CHOLHDL", "VLDL", "LDLCALC", "LDLDIRECT"     Physical Exam:     VS:  BP 143/78   Pulse 101   Ht 5\' 10"  (1.778 m)   Wt 217 lb (98.4 kg)   SpO2 98%   BMI 31.14 kg/m         Wt Readings from Last 3 Encounters:  06/10/22 217 lb (98.4 kg)  05/15/22 217 lb 6.4 oz (98.6 kg)  05/12/22 222 lb 3.2 oz (100.8 kg)      GEN:  Well nourished, well developed in no acute distress HEENT: Normal NECK: No JVD; No carotid bruits LYMPHATICS: No  lymphadenopathy CARDIAC: RRR, no murmurs, rubs, gallops RESPIRATORY:  Clear to auscultation without rales, wheezing or rhonchi  ABDOMEN: Soft, non-tender, non-distended MUSCULOSKELETAL:  No edema; No deformity  SKIN: Warm and  dry NEUROLOGIC:  Alert and oriented x 3 PSYCHIATRIC:  Normal affect            Assessment ASSESSMENT:     1. AF (paroxysmal atrial fibrillation) ([place])   2. Primary hypertension   3. Pre-procedure lab exam     PLAN:     In order of problems listed above:   #pAF Highly symptomatic paroxysms of atrial fibrillation.  She would like a more durable rhythm control strategy.  We discussed antiarrhythmic drugs and catheter ablation.  She is interested in pursuing catheter ablation which I think is very reasonable.   CHA2DS2-VASc Score = 2  The patient's score is based upon: CHF History: 0 HTN History: 1 Diabetes History: 0 Stroke History: 0 Vascular Disease History: 0 Age Score: 0 Gender Score: 1     Discussed treatment options today for their AF including antiarrhythmic drug therapy and ablation. Discussed risks, recovery and likelihood of success. Discussed potential need for repeat ablation procedures and antiarrhythmic drugs after an initial ablation. They wish to proceed with scheduling.   Risk, benefits, and alternatives to EP study and radiofrequency ablation for afib were also discussed in detail today. These risks include but are not limited to stroke, bleeding, vascular damage, tamponade, perforation, damage to the esophagus, lungs, and other structures, pulmonary vein stenosis, worsening renal function, and death. The patient understands these risk and wishes to proceed.  We will therefore proceed with catheter ablation at the next available time.  Carto, ICE, anesthesia are requested for the procedure.  Will also obtain CT PV protocol prior to the procedure to exclude LAA thrombus and further evaluate atrial anatomy.     She will need to start her  Eliquis 4 weeks prior to the scheduled procedure today.   We will take flecainide and metoprolol off her medication list.   Presents for PVI. Procedure reviewed.   Signed, Lars Mage, MD, [facility], [facility] 09/25/2022 Electrophysiology [facility]

## 2022-09-28 ENCOUNTER — Encounter (HOSPITAL_COMMUNITY): Payer: Self-pay | Admitting: Cardiology

## 2022-09-28 LAB — POCT ACTIVATED CLOTTING TIME
Activated Clotting Time: 282 seconds
Activated Clotting Time: 288 seconds

## 2022-10-15 ENCOUNTER — Encounter: Payer: Self-pay | Admitting: Cardiology

## 2022-10-15 NOTE — Progress Notes (Signed)
Cardiology Office Note Date:  10/16/2022  Patient ID:  Kelly [name], Kelly [name] 02/21/1981, MRN [id number] PCP:  Marisue Ivan, MD  Cardiologist:  Lanier Prude, MD Electrophysiologist: Lanier Prude, MD    Chief Complaint: fatigue, chest pain s/p AF ablation  History of Present Illness: Kelly [name] is a 42 y.o. female with PMH notable for parox Afib, HTN, obesity; seen today for Lanier Prude, MD for acute visit due to increased fatigue, chest pain since 09/25/22 AF ablation .    She is having very little sleep at night except, ironically, last night. She frequently wakes up at 3-4am and can not get back to sleep. She had a sleep study that did not show OSA.  She is having L chest twinges/pain, and separately having central chest burning. Neither the chest pain or the chest burning are associated with any other symptoms or activity. Can happen at rest or with activity. She had the central chest burning prior to ablation, but seems to be worse now. She can get the burning to lessen in severity by sitting more upright and taking slow, deep breaths, but still happening.  Her BP readings at home are frequently elevated 140-160s, sometimes as low as 110s. She tends to only check BP when she doesn't feel well, or having increased chest pains. Is intermittently dizzy.  She knows that her anxiety is contributing to her symptoms, has not discussed further with PCP.    Past Medical History:  Diagnosis Date   Anxiety    Demand ischemia    a. In setting of rapid afib-->09/2021 Cor CTA: Ca2+ = 0. Nl cors. Sm hiatal hernia.   Depression    History of echocardiogram    a. 08/2020 Echo: EF 55-60%, no rmwa, nl RV fxn.   Hypertension    Morbid obesity    PAF (paroxysmal atrial fibrillation)    a. 08/2020 -> CHA2DS2VASc = 2-->ASA only; b. 08/2021 Zio: Predominantly sinus rhythm with brief run of SVT and no sustained arrhythmias.  Triggered events associate with sinus rhythm; c.   04/2022 recurrent A-fib: As needed flecainide and eliquis Rx.   PONV (postoperative nausea and vomiting)    Sleep-disordered breathing     Past Surgical History:  Procedure Laterality Date   ATRIAL FIBRILLATION ABLATION N/A 09/25/2022   Procedure: ATRIAL FIBRILLATION ABLATION;  Surgeon: Lanier Prude, MD;  [facility]: [facility];  Service: Cardiovascular;  Laterality: N/A;   CESAREAN SECTION N/A    X2   CHOLECYSTECTOMY N/A 01/11/2015   Procedure: LAPAROSCOPIC CHOLECYSTECTOMY WITH INTRAOPERATIVE CHOLANGIOGRAM;  Surgeon: Tiney Rouge III, MD;  Location: [facility] ORS;  Service: General;  Laterality: N/A;   ESOPHAGOGASTRODUODENOSCOPY (EGD) WITH PROPOFOL N/A 01/15/2022   Procedure: ESOPHAGOGASTRODUODENOSCOPY (EGD) WITH PROPOFOL;  Surgeon: Toney Reil, MD;  Location: [facility] ENDOSCOPY;  Service: Gastroenterology;  Laterality: N/A;    Current Outpatient Medications  Medication Instructions   ALAYCEN 1/35 tablet 1 tablet, Oral, Daily   Calcium Carbonate (CALCIUM 600 PO) 1 tablet, Oral, Daily   cyanocobalamin (VITAMIN B12) 1,000 mcg, Intramuscular, Every 30 days   diltiazem (CARDIZEM CD) 180 MG 24 hr capsule Oral, Daily   Eliquis 5 mg, Oral, 2 times daily   loratadine (CLARITIN) 10 mg, Oral, Nightly   metoprolol tartrate (LOPRESSOR) 50 mg, Oral, As needed   nitroGLYCERIN (NITROSTAT) 0.4 mg, Sublingual, Every 5 min PRN   omeprazole (PRILOSEC) 40 MG capsule TAKE 1 CAPSULE (40 MG TOTAL) BY MOUTH IN THE MORNING AND ONE CAPSULE  AT BEDTIME.   pantoprazole (PROTONIX) 40 mg, Oral, Daily   potassium chloride SA (KLOR-CON M) 20 MEQ tablet 20 mEq, Oral, Daily    Social History:  The patient  reports that she has never smoked. She has never used smokeless tobacco. She reports that she does not drink alcohol and does not use drugs.   Family History:  The patient's family history includes Diabetes in her mother; Heart disease in her father; Hyperlipidemia in her mother; Hypertension in her  mother.  ROS:  Please see the history of present illness. All other systems are reviewed and otherwise negative.   PHYSICAL EXAM:   Vitals:   10/16/22 1036 10/16/22 1255  BP: (!) 160/90 (!) 146/88  Pulse: (!) 102   Height: 5\' 10"  (1.778 m)   Weight: 216 lb (98 kg)   SpO2: 99%   BMI (Calculated): 30.99     GEN- The patient is well appearing, alert and oriented x 3 today.   Lungs- Clear to ausculation bilaterally, normal work of breathing.  Heart- Regular rate and rhythm, no murmurs, rubs or gallops Extremities- Trace peripheral edema, warm, dry   EKG is ordered. Personal review of EKG from today shows: Sinus tach at 102bpm  Recent Labs: 04/30/2022: Magnesium 2.3 09/14/2022: Hemoglobin 13.5; Platelets 243 09/23/2022: BUN 10; Creatinine, Ser 0.71; Potassium 3.8; Sodium 138 10/16/2022: TSH 1.581  No results found for requested labs within last 365 days.   CrCl cannot be calculated (Patient's most recent lab result is older than the maximum 21 days allowed.).   Wt Readings from Last 3 Encounters:  10/16/22 216 lb (98 kg)  09/25/22 215 lb (97.5 kg)  06/10/22 217 lb (98.4 kg)     Additional studies reviewed include: Previous EP, cardiology notes.   AF ablation, 09/25/2022 1. Successful PVI 2. Intracardiac echo reveals trivial pericardial effusion, dilated LA 3. No early apparent complications. 4. Protonix 40mg  PO daily x 45 days 5. Colchicine 0.6mg  PO BID x 5 days  Long Term Monitor, 07/15/2021 HR 49 - 159 bpm, average 78 bpm. Rare supraventricular and ventricular ectopy. No sustained arrhythmias. Patient triggered episodes corresponded to sinus rhythm.  TTE, 08/26/2020  1. Left ventricular ejection fraction, by estimation, is 55 to 60%. The left ventricle has normal function. The left ventricle has no regional wall motion abnormalities. Left ventricular diastolic parameters were normal.   2. Right ventricular systolic function is normal. The right ventricular size is  normal.   3. The mitral valve is grossly normal. No evidence of mitral valve regurgitation.   4. The aortic valve is tricuspid. Aortic valve regurgitation is not visualized.   ASSESSMENT AND PLAN:  #) parox Afib #) L chest pain #) sternal chest discomfort #) anxiety #) palpitations S/p AF ablation Managed with cardizem CD 180mg  + PRN lopressor Considered increasing cardizem, but will not at this time d/t patient's intermittent dizziness Will update echo to confirm no pericarditis or pericardial effusion Extensive reassurance provided that patient is in sinus rhythm and not Afib today Strongly recommended that she follow-up with PCP to discuss anxiety, sleep study results/recommendations - will update thyroid labs today  #) Hypercoag d/t AFib CHA2DS2-VASc Score = 2 (HTN, gender)  OAC - eliquis 5mg  BID, appropriately dosed      Current medicines are reviewed at length with the patient today.   The patient has concerns regarding her medicines.  The following changes were made today:  none  Labs/ tests ordered today include:  Orders Placed This Encounter  Procedures   TSH   T4, free   T3   EKG 12-Lead   ECHOCARDIOGRAM COMPLETE     Disposition: Follow up with Afib Clinic in as usual post procedure   Signed, Sherie DonSuzann Tonni Mansour, NP  10/16/22  12:54 PM  Electrophysiology [facility]

## 2022-10-15 NOTE — Telephone Encounter (Signed)
Spoke with the patient who reports that she has had intermittent chest pain since her ablation. She states that she will get a random sharp pain from her chest to her shoulder. It is not exacerbated by any movements and occurs and rest. It resolves quickly on its own. She states episodes have lessened over the past week but still come and go. The pain in the center of her chest is nothing new, she states that this has been ongoing for over a year now and has not changed since her ablation. She also reports increased fatigue with activity. She denies any shortness of breath. Reports getting tired very easily. She works from home and sits at her desk most of the day, when she gets up to go out anywhere she gets very worn out.  I have advised the patient on ER precautions with her symptoms. She is scheduled to see Levy Sjogren, NP tomorrow for evaluation.

## 2022-10-16 ENCOUNTER — Encounter: Payer: Self-pay | Admitting: Cardiology

## 2022-10-16 ENCOUNTER — Other Ambulatory Visit
Admission: RE | Admit: 2022-10-16 | Discharge: 2022-10-16 | Disposition: A | Discharge status: Home / Self Care | Payer: No Typology Code available for payment source | Attending: Cardiology | Admitting: Cardiology

## 2022-10-16 ENCOUNTER — Ambulatory Visit: Payer: No Typology Code available for payment source | Attending: Cardiology | Admitting: Cardiology

## 2022-10-16 VITALS — BP 146/88 | HR 102 | Ht 70.0 in | Wt 216.0 lb

## 2022-10-16 DIAGNOSIS — I4891 Unspecified atrial fibrillation: Secondary | ICD-10-CM

## 2022-10-16 DIAGNOSIS — R002 Palpitations: Secondary | ICD-10-CM

## 2022-10-16 DIAGNOSIS — R072 Precordial pain: Secondary | ICD-10-CM | POA: Diagnosis not present

## 2022-10-16 DIAGNOSIS — F419 Anxiety disorder, unspecified: Secondary | ICD-10-CM

## 2022-10-16 DIAGNOSIS — R079 Chest pain, unspecified: Secondary | ICD-10-CM | POA: Diagnosis not present

## 2022-10-16 LAB — TSH: TSH: 1.581 u[IU]/mL (ref 0.350–4.500)

## 2022-10-16 LAB — T4, FREE: Free T4: 0.86 ng/dL (ref 0.61–1.12)

## 2022-10-16 NOTE — Patient Instructions (Addendum)
Medication Instructions:  Your physician recommends that you continue on your current medications as directed. Please refer to the Current Medication list given to you today.  *If you need a refill on your cardiac medications before your next appointment, please call your pharmacy*  Lab Work: TSH, Free T3, and T3 - Please go to the [facility]. You will check in at the front desk to the right as you walk into the atrium. Valet Parking is offered if needed. - No appointment needed. You may go any day between 7 am and 6 pm.  If you have labs (blood work) drawn today and your tests are completely normal, you will receive your results only by: MyChart Message (if you have MyChart) OR A paper copy in the mail If you have any lab test that is abnormal or we need to change your treatment, we will call you to review the results.  Testing/Procedures: Your physician has requested that you have an echocardiogram. Echocardiography is a painless test that uses sound waves to create images of your heart. It provides your doctor with information about the size and shape of your heart and how well your heart's chambers and valves are working. This procedure takes approximately one hour. There are no restrictions for this procedure. Please do NOT wear cologne, perfume, aftershave, or lotions (deodorant is allowed). Please arrive 15 minutes prior to your appointment time.   Follow-Up: At [facility], you and your health needs are our priority.  As part of our continuing mission to provide you with exceptional heart care, we have created designated Provider Care Teams.  These Care Teams include your primary Cardiologist (physician) and Advanced Practice Providers (APPs -  Physician Assistants and Nurse Practitioners) who all work together to provide you with the care you need, when you need it.  We recommend signing up for the patient portal called "MyChart".  Sign up information is provided on  this After Visit Summary.  MyChart is used to connect with patients for Virtual Visits (Telemedicine).  Patients are able to view lab/test results, encounter notes, upcoming appointments, etc.  Non-urgent messages can be sent to your provider as well.   To learn more about what you can do with MyChart, go to [email or web address].    Your next appointment:   Keep appointment as scheduled with [facility] on 10/26/22

## 2022-10-17 LAB — T3: T3, Total: 182 ng/dL — ABNORMAL HIGH (ref 71–180)

## 2022-10-21 ENCOUNTER — Ambulatory Visit: Payer: No Typology Code available for payment source | Admitting: Cardiology

## 2022-10-22 ENCOUNTER — Other Ambulatory Visit (HOSPITAL_COMMUNITY): Payer: Self-pay

## 2022-10-26 ENCOUNTER — Ambulatory Visit (HOSPITAL_COMMUNITY)
Admission: RE | Admit: 2022-10-26 | Discharge: 2022-10-26 | Disposition: A | Discharge status: Home / Self Care | Payer: No Typology Code available for payment source | Source: Ambulatory Visit | Attending: Physician Assistant | Admitting: Physician Assistant

## 2022-10-26 ENCOUNTER — Encounter (HOSPITAL_COMMUNITY): Payer: Self-pay | Admitting: Physician Assistant

## 2022-10-26 VITALS — BP 128/86 | HR 77 | Ht 70.0 in | Wt 220.0 lb

## 2022-10-26 DIAGNOSIS — Z7901 Long term (current) use of anticoagulants: Secondary | ICD-10-CM | POA: Insufficient documentation

## 2022-10-26 DIAGNOSIS — I10 Essential (primary) hypertension: Secondary | ICD-10-CM | POA: Diagnosis not present

## 2022-10-26 DIAGNOSIS — Z79899 Other long term (current) drug therapy: Secondary | ICD-10-CM | POA: Diagnosis not present

## 2022-10-26 DIAGNOSIS — I480 Paroxysmal atrial fibrillation: Secondary | ICD-10-CM | POA: Diagnosis present

## 2022-10-26 NOTE — Progress Notes (Signed)
Primary Care Physician: Marisue Ivan, MD Primary Cardiologist: none Primary Electrophysiologist: Dr Lalla Brothers Referring Physician: Dr Cameron Sprang Kelly [name] is a 42 y.o. female with a history of HTN and atrial fibrillation who presents for follow up in the [facility]. In February 2022, she presented to the emergency department with palpitations and found to be in rapid atrial fibrillation.  She spontaneous converted to sinus rhythm. She had repeat ED evaluations in August and again in November 2022 in the setting of tachycardia which was felt to be mostly sinus tachycardia.  She was evaluated by EP in November 2022 and given overall paucity of A-fib, she was not felt to require anticoagulation.  In February 2023, she underwent event monitoring which showed predominantly sinus rhythm without sustained arrhythmias.  Brief runs of SVT noted.  Triggered events were associated with sinus rhythm.    She presented to the emergency department on October 26 after awakening with tachypalpitations. She was found to be in atrial fibrillation at a rate of 220 bpm. She was placed on intravenous diltiazem and subsequently oral beta-blocker therapy, and converted to sinus rhythm.  Flecainide 100 mg twice daily as needed was prescribed in addition to metoprolol 50 mg. Patient is on Eliquis for a CHADS2VASC score of 2.  On follow up today, patient is s/p afib ablation with Dr Lalla Brothers on 09/25/22. She was seen by Sherie Don with L sided chest pain, no medicine changes at that time, echocardiogram was ordered. Patient reports today that her chest pain has improved. She really only has discomfort when she is under a lot of stress. She was recently started on Paxil by her PCP. No swallowing pain or groin issues. She does have brief palpitations but no sustained episodes.   Today, she denies symptoms of shortness of breath, orthopnea, PND, lower extremity edema, dizziness,  presyncope, syncope, snoring, daytime somnolence, bleeding, or neurologic sequela. The patient is tolerating medications without difficulties and is otherwise without complaint today.    Atrial Fibrillation Risk Factors:  she does not have symptoms or diagnosis of sleep apnea. she does not have a history of rheumatic fever.   she has a BMI of Body mass index is 31.57 kg/m.Marland Kitchen Filed Weights   10/26/22 0930  Weight: 99.8 kg    Family History  Problem Relation Age of Onset   Diabetes Mother    Hypertension Mother    Hyperlipidemia Mother    Heart disease Father    Breast cancer Neg Hx      Atrial Fibrillation Management history:  Previous antiarrhythmic drugs: PRN flecainide (never took) Previous cardioversions: none Previous ablations: 09/25/22 Anticoagulation history: Eliquis   Past Medical History:  Diagnosis Date   Anxiety    Demand ischemia    a. In setting of rapid afib-->09/2021 Cor CTA: Ca2+ = 0. Nl cors. Sm hiatal hernia.   Depression    History of echocardiogram    a. 08/2020 Echo: EF 55-60%, no rmwa, nl RV fxn.   Hypertension    Morbid obesity    PAF (paroxysmal atrial fibrillation)    a. 08/2020 -> CHA2DS2VASc = 2-->ASA only; b. 08/2021 Zio: Predominantly sinus rhythm with brief run of SVT and no sustained arrhythmias.  Triggered events associate with sinus rhythm; c.  04/2022 recurrent A-fib: As needed flecainide and eliquis Rx.   PONV (postoperative nausea and vomiting)    Sleep-disordered breathing    Past Surgical History:  Procedure Laterality Date   ATRIAL FIBRILLATION ABLATION  N/A 09/25/2022   Procedure: ATRIAL FIBRILLATION ABLATION;  Surgeon: Lanier Prude, MD;  Location: [facility] INVASIVE CV LAB;  Service: Cardiovascular;  Laterality: N/A;   CESAREAN SECTION N/A    X2   CHOLECYSTECTOMY N/A 01/11/2015   Procedure: LAPAROSCOPIC CHOLECYSTECTOMY WITH INTRAOPERATIVE CHOLANGIOGRAM;  Surgeon: Tiney Rouge III, MD;  Location: [facility] ORS;  Service: General;   Laterality: N/A;   ESOPHAGOGASTRODUODENOSCOPY (EGD) WITH PROPOFOL N/A 01/15/2022   Procedure: ESOPHAGOGASTRODUODENOSCOPY (EGD) WITH PROPOFOL;  Surgeon: Toney Reil, MD;  Location: [facility] ENDOSCOPY;  Service: Gastroenterology;  Laterality: N/A;    Current Outpatient Medications  Medication Sig Dispense Refill   ALAYCEN 1/35 tablet Take 1 tablet by mouth daily.     Calcium Carbonate (CALCIUM 600 PO) Take 1 tablet by mouth daily.     cyanocobalamin (VITAMIN B12) 1000 MCG/ML injection Inject 1,000 mcg into the muscle every 30 (thirty) days.     diltiazem (CARDIZEM CD) 180 MG 24 hr capsule TAKE 1 CAPSULE BY MOUTH EVERY DAY 90 capsule 1   ELIQUIS 5 MG TABS tablet Take 1 tablet (5 mg total) by mouth 2 (two) times daily. 60 tablet 5   hydrOXYzine (ATARAX) 25 MG tablet Take by mouth.     loratadine (CLARITIN) 10 MG tablet Take 10 mg by mouth at bedtime.     metoprolol tartrate (LOPRESSOR) 50 MG tablet Take 50 mg by mouth as needed (afib).     nitroGLYCERIN (NITROSTAT) 0.4 MG SL tablet Place 1 tablet (0.4 mg total) under the tongue every 5 (five) minutes as needed for chest pain. 10 tablet 0   omeprazole (PRILOSEC) 40 MG capsule TAKE 1 CAPSULE (40 MG TOTAL) BY MOUTH IN THE MORNING AND ONE CAPSULE AT BEDTIME. 180 capsule 3   pantoprazole (PROTONIX) 40 MG tablet Take 1 tablet (40 mg total) by mouth daily. 45 tablet 0   PARoxetine (PAXIL) 10 MG tablet Take 1 tablet by mouth at bedtime.     potassium chloride SA (KLOR-CON M) 20 MEQ tablet Take 1 tablet (20 mEq total) by mouth daily. 90 tablet 3   No current facility-administered medications for this encounter.    No Known Allergies  Social History   Socioeconomic History   Marital status: Married    Spouse name: Not on file   Number of children: Not on file   Years of education: Not on file   Highest education level: Not on file  Occupational History   Not on file  Tobacco Use   Smoking status: Never   Smokeless tobacco: Never   Tobacco  comments:    Never smoke 10/26/22  Vaping Use   Vaping Use: Never used  Substance and Sexual Activity   Alcohol use: No   Drug use: No   Sexual activity: Yes  Other Topics Concern   Not on file  Social History Narrative   Not on file   Social Determinants of Health   Financial Resource Strain: Not on file  Food Insecurity: No Food Insecurity (04/30/2022)   Hunger Vital Sign    Worried About Running Out of Food in the Last Year: Never true    Ran Out of Food in the Last Year: Never true  Transportation Needs: No Transportation Needs (04/30/2022)   PRAPARE - Administrator, Civil Service (Medical): No    Lack of Transportation (Non-Medical): No  Physical Activity: Not on file  Stress: Not on file  Social Connections: Not on file  Intimate Partner Violence: Not At Risk (04/30/2022)  Humiliation, Afraid, Rape, and Kick questionnaire    Fear of Current or Ex-Partner: No    Emotionally Abused: No    Physically Abused: No    Sexually Abused: No     ROS- All systems are reviewed and negative except as per the HPI above.  Physical Exam: Vitals:   10/26/22 0930  BP: 128/86  Pulse: 77  Weight: 99.8 kg  Height:  (1.778 m)    GEN- The patient is a well appearing female, alert and oriented x 3 today.   Head- normocephalic, atraumatic Eyes-  Sclera clear, conjunctiva pink Ears- hearing intact Oropharynx- clear Neck- supple  Lungs- Clear to ausculation bilaterally, normal work of breathing Heart- Regular rate and rhythm, no murmurs, rubs or gallops  GI- soft, NT, ND, + BS Extremities- no clubbing, cyanosis, or edema MS- no significant deformity or atrophy Skin- no rash or lesion Psych- euthymic mood, full affect Neuro- strength and sensation are intact  Wt Readings from Last 3 Encounters:  10/26/22 99.8 kg  10/16/22 98 kg  09/25/22 97.5 kg    EKG today demonstrates  SR Vent. rate 77 BPM PR interval 148 ms QRS duration 68 ms QT/QTcB 364/411  ms  Echo 08/26/20 demonstrated  1. Left ventricular ejection fraction, by estimation, is 55 to 60%. The  left ventricle has normal function. The left ventricle has no regional  wall motion abnormalities. Left ventricular diastolic parameters were  normal.   2. Right ventricular systolic function is normal. The right ventricular  size is normal.   3. The mitral valve is grossly normal. No evidence of mitral valve  regurgitation.   4. The aortic valve is tricuspid. Aortic valve regurgitation is not  visualized.   Epic records are reviewed at length today.  CHA2DS2-VASc Score = 2  The patient's score is based upon: CHF History: 0 HTN History: 1 Diabetes History: 0 Stroke History: 0 Vascular Disease History: 0 Age Score: 0 Gender Score: 1       ASSESSMENT AND PLAN: 1. Paroxysmal Atrial Fibrillation (ICD10:  I48.0) The patient's CHA2DS2-VASc score is 2, indicating a 2.2% annual risk of stroke.   S/p afib ablation 09/25/22 Patient appears to be maintaining SR. Hopefully ectopy will continue to decrease post ablation.  Continue Eliquis 5 mg BID with no missed doses for 3 months post ablation.  Continue diltiazem 180 mg daily She has Lopressor 50 mg PRN for heart racing.  Repeat echo scheduled 5/3  2. HTN Stable, no changes today.   Follow up with Dr Lalla Brothers as scheduled.    Jorja Loa PA-C [facility] [address] [place], [place] [address] [phone] 10/26/2022 9:35 AM

## 2022-11-06 ENCOUNTER — Ambulatory Visit
Admission: RE | Admit: 2022-11-06 | Discharge: 2022-11-06 | Disposition: A | Discharge status: Home / Self Care | Payer: No Typology Code available for payment source | Source: Ambulatory Visit | Attending: Cardiology | Admitting: Cardiology

## 2022-11-06 DIAGNOSIS — I10 Essential (primary) hypertension: Secondary | ICD-10-CM | POA: Insufficient documentation

## 2022-11-06 DIAGNOSIS — I480 Paroxysmal atrial fibrillation: Secondary | ICD-10-CM | POA: Insufficient documentation

## 2022-11-06 DIAGNOSIS — R079 Chest pain, unspecified: Secondary | ICD-10-CM | POA: Diagnosis present

## 2022-11-06 LAB — ECHOCARDIOGRAM COMPLETE
AR max vel: 2.6 cm2
AV Area VTI: 2.59 cm2
AV Area mean vel: 2.36 cm2
AV Mean grad: 5 mmHg
AV Peak grad: 8.8 mmHg
Ao pk vel: 1.49 m/s
Area-P 1/2: 3.89 cm2
MV VTI: 2.67 cm2
S' Lateral: 2.7 cm

## 2022-11-06 NOTE — Progress Notes (Signed)
*  PRELIMINARY RESULTS* Echocardiogram 2D Echocardiogram has been performed.  Cristela Blue 11/06/2022, 10:32 AM

## 2022-11-15 IMAGING — CT CT HEART MORP W/ CTA COR W/ SCORE W/ CA W/CM &/OR W/O CM
1 of 13 series · 4 of 20 positions shown, 5 images · non-contrast
Comparison: CTA chest dated September 10, 2021.

Addendum:
CLINICAL DATA: Chest pain

EXAM:
Cardiac/Coronary  CTA
TECHNIQUE: The patient was scanned on a Siemens Somatom go.Top scanner.

[Series 27: multiphase % cta coronary 0.60 · axial · 0.41mm/px · z∈[-1110,-1045]mm · 4 of 3216 slices shown, 5 images]
[im 644/3216  vessel]
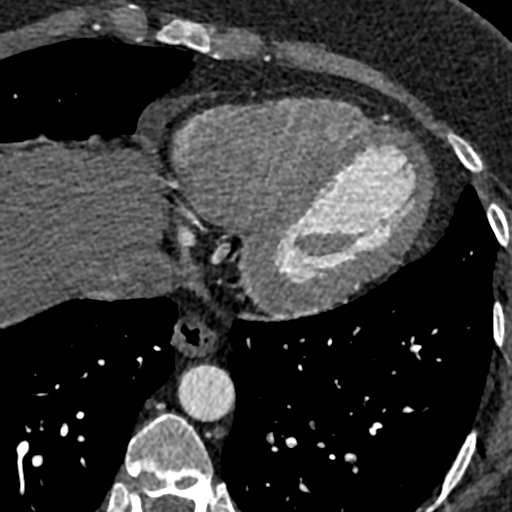
[im 644/3216  lung]
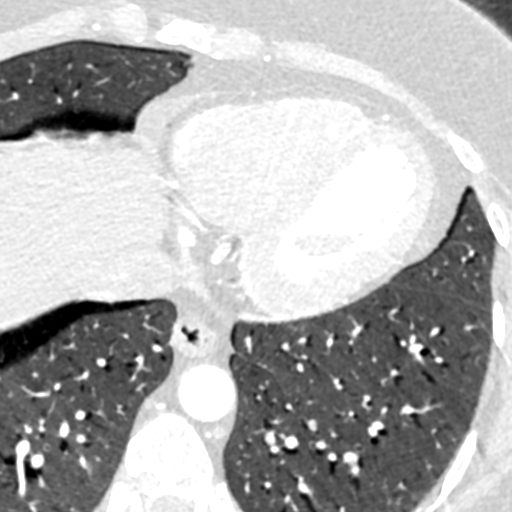
[im 1287/3216  vessel]
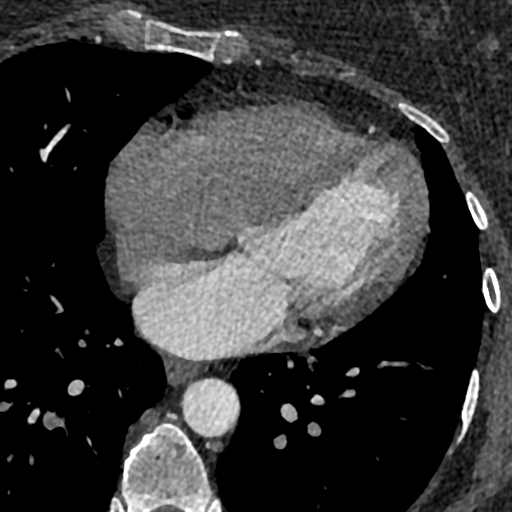
[im 1930/3216  vessel]
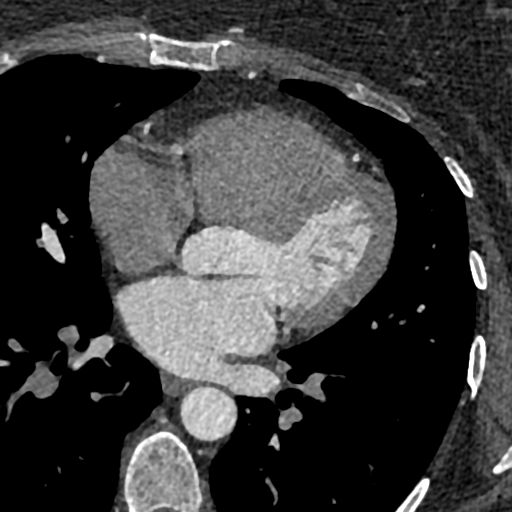
[im 2573/3216  vessel]
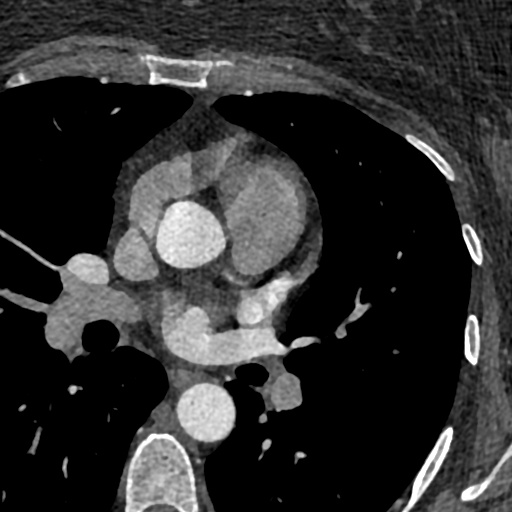

[4 of 20 positions shown; findings below may reference images not displayed]

:
A retrospective scan was triggered in the descending thoracic aorta.
Axial non-contrast 3 mm slices were carried out through the heart.
The data set was analyzed on a dedicated work station and scored
using the Agatson method. Gantry rotation speed was 330 msecs and
collimation was .6 mm. 240mg cardizem, 100mg of metoprolol and
mg of sl NTG was given. The 3D data set was reconstructed in 5%
intervals of the 60-95 % of the R-R cycle. Diastolic phases were
analyzed on a dedicated work station using MPR, MIP and VRT modes.
The patient received 80 cc of contrast.
FINDINGS: Aorta:  Normal size.  No calcifications.  No dissection.

Aortic Valve:  Trileaflet.  No calcifications.

Coronary Arteries:  Normal coronary origin.  Right dominance.

RCA is a dominant artery that gives rise to PDA and PLA. There is no
plaque.

Left main gives rise to LAD and LCX arteries. There is no LM
disease.

LAD has no plaque.

LCX is a non-dominant artery that gives rise to two obtuse marginal
branches. There is no plaque.

Other findings:

Normal pulmonary vein drainage into the left atrium.

Normal left atrial appendage without a thrombus.

Normal size of the pulmonary artery.
IMPRESSION: 1. Normal coronary calcium score of 0. Patient is low risk for
coronary events.

2.  Normal coronary origin with right dominance.

3.  No evidence of CAD.

4.  CAD-RADS 0. Consider non-atherosclerotic causes of chest pain.

EXAM:
OVER-READ INTERPRETATION  CT CHEST

The following report is an over-read performed by radiologist Dr.
over-read does not include interpretation of cardiac or coronary
anatomy or pathology. The coronary CTA interpretation by the
cardiologist is attached.
FINDINGS: Mediastinum/Nodes: There are no enlarged mediastinal or hilar lymph
nodes. The visualized esophagus demonstrate no significant findings.
Small hiatal hernia.

Lungs/Pleura: The visualized lungs are clear.

Upper abdomen:  No acute abnormality.

Musculoskeletal: No acute or significant osseous finding.
IMPRESSION: 1. Small hiatal hernia.

*** End of Addendum ***
:
A retrospective scan was triggered in the descending thoracic aorta.
Axial non-contrast 3 mm slices were carried out through the heart.
The data set was analyzed on a dedicated work station and scored
using the Agatson method. Gantry rotation speed was 330 msecs and
collimation was .6 mm. 240mg cardizem, 100mg of metoprolol and
mg of sl NTG was given. The 3D data set was reconstructed in 5%
intervals of the 60-95 % of the R-R cycle. Diastolic phases were
analyzed on a dedicated work station using MPR, MIP and VRT modes.
The patient received 80 cc of contrast.
FINDINGS: Aorta:  Normal size.  No calcifications.  No dissection.

Aortic Valve:  Trileaflet.  No calcifications.

Coronary Arteries:  Normal coronary origin.  Right dominance.

RCA is a dominant artery that gives rise to PDA and PLA. There is no
plaque.

Left main gives rise to LAD and LCX arteries. There is no LM
disease.

LAD has no plaque.

LCX is a non-dominant artery that gives rise to two obtuse marginal
branches. There is no plaque.

Other findings:

Normal pulmonary vein drainage into the left atrium.

Normal left atrial appendage without a thrombus.

Normal size of the pulmonary artery.
IMPRESSION: 1. Normal coronary calcium score of 0. Patient is low risk for
coronary events.

2.  Normal coronary origin with right dominance.

3.  No evidence of CAD.

4.  CAD-RADS 0. Consider non-atherosclerotic causes of chest pain.

## 2022-11-24 NOTE — Progress Notes (Unsigned)
  Electrophysiology Office Follow up Visit Note:    Date:  11/25/2022   ID:  Kelly [name], DOB 12/06/1980, MRN [id number]  PCP:  Marisue Ivan, MD  [facility] Cardiologist:  Lanier Prude, MD  [facility] Electrophysiologist:  Lanier Prude, MD    Interval History:    Kelly [name] is a 42 y.o. female who presents for a follow up visit.   She had an AF ablation 09/25/2022. During the ablation, the veins were isolated. Doing well after the procedure without sustained recurrence.   She continues to take Eliquis for stroke ppx.  She is doing well today in clinic.  Reports no sustained episodes of atrial fibrillation.  Early after the ablation she felt some palpitations but no sustained arrhythmias.  She continues to take Eliquis without bleeding issues.     Past medical, surgical, social and family history were reviewed.  ROS:   Please see the history of present illness.    All other systems reviewed and are negative.  EKGs/Labs/Other Studies Reviewed:    The following studies were reviewed today:  11/06/2022 echo EF 60 RV normal   EKG:  The ekg ordered today demonstrates sinus rhythm.   Physical Exam:    VS:  BP 118/70 (BP Location: Left Arm, Patient Position: Sitting, Cuff Size: Large)   Pulse 70   Ht 5\' 10"  (1.778 m)   Wt 219 lb 2 oz (99.4 kg)   LMP 10/12/2022   SpO2 98%   BMI 31.44 kg/m     Wt Readings from Last 3 Encounters:  11/25/22 219 lb 2 oz (99.4 kg)  10/26/22 220 lb (99.8 kg)  10/16/22 216 lb (98 kg)     GEN:  Well nourished, well developed in no acute distress CARDIAC: RRR, no murmurs, rubs, gallops RESPIRATORY:  Clear to auscultation without rales, wheezing or rhonchi       ASSESSMENT:    1. Paroxysmal atrial fibrillation (HCC)   2. Primary hypertension    PLAN:    In order of problems listed above:  #Paroxysmal AF Doing well after her March ablation. Coninue eliquis for now.  Plan to stop in 6 months  after the catheter ablation and transition to aspirin 81 mg by mouth once daily given a low CHA2DS2-VASc score. She does monitor her heart rhythm using a wearable device. Stop diltiazem today.  #Hypertension At goal today.  Recommend checking blood pressures 1-2 times per week at home and recording the values.  Recommend bringing these recordings to the primary care physician.   Follow-up 1 year with APP.    Signed, Steffanie Dunn, MD, [facility], [facility] 11/25/2022 2:34 PM    Electrophysiology [facility]

## 2022-11-25 ENCOUNTER — Ambulatory Visit: Payer: No Typology Code available for payment source | Attending: Cardiology | Admitting: Cardiology

## 2022-11-25 ENCOUNTER — Encounter: Payer: Self-pay | Admitting: Cardiology

## 2022-11-25 VITALS — BP 118/70 | HR 70 | Ht 70.0 in | Wt 219.1 lb

## 2022-11-25 DIAGNOSIS — I10 Essential (primary) hypertension: Secondary | ICD-10-CM

## 2022-11-25 DIAGNOSIS — I480 Paroxysmal atrial fibrillation: Secondary | ICD-10-CM | POA: Diagnosis not present

## 2022-11-25 NOTE — Patient Instructions (Signed)
Medication Instructions:  Your physician has recommended you make the following change in your medication:  1) STOP taking diltiazem  2) ON 03/28/2023: STOP taking Eliquis and START taking Aspirin 81 mg daily   *If you need a refill on your cardiac medications before your next appointment, please call your pharmacy*  Follow-Up: At [facility], you and your health needs are our priority.  As part of our continuing mission to provide you with exceptional heart care, we have created designated Provider Care Teams.  These Care Teams include your primary Cardiologist (physician) and Advanced Practice Providers (APPs -  Physician Assistants and Nurse Practitioners) who all work together to provide you with the care you need, when you need it.  Your next appointment:   1 year  Provider:   Sherie Don, NP

## 2022-12-07 ENCOUNTER — Other Ambulatory Visit: Payer: Self-pay | Admitting: Medical

## 2023-03-31 ENCOUNTER — Telehealth: Payer: Self-pay

## 2023-03-31 MED ORDER — ASPIRIN 81 MG PO TBEC: 81.0000 mg | DELAYED_RELEASE_TABLET | Freq: Every day | ORAL | Status: AC

## 2023-03-31 NOTE — Telephone Encounter (Signed)
Spoke with the patient and she is aware that she can stop her Eliquis. She will start on ASA 81 mg daily.

## 2023-03-31 NOTE — Telephone Encounter (Signed)
-----   Message from Nurse Farrie Sann C sent at 11/25/2022  2:50 PM EDT ----- Patient needs to stop taking Eliquis on 9/22 and start on aspirin 81 daily (6 months post-ablation)

## 2023-04-05 ENCOUNTER — Other Ambulatory Visit: Payer: Self-pay | Admitting: Obstetrics and Gynecology

## 2023-04-05 DIAGNOSIS — Z1231 Encounter for screening mammogram for malignant neoplasm of breast: Secondary | ICD-10-CM

## 2023-04-22 ENCOUNTER — Ambulatory Visit
Admission: RE | Admit: 2023-04-22 | Discharge: 2023-04-22 | Disposition: A | Discharge status: Home / Self Care | Payer: No Typology Code available for payment source | Source: Ambulatory Visit | Attending: Obstetrics and Gynecology | Admitting: Obstetrics and Gynecology

## 2023-04-22 DIAGNOSIS — Z1231 Encounter for screening mammogram for malignant neoplasm of breast: Secondary | ICD-10-CM | POA: Diagnosis present

## 2023-09-04 ENCOUNTER — Other Ambulatory Visit: Payer: Self-pay | Admitting: Cardiology

## 2023-11-09 NOTE — Progress Notes (Unsigned)
  Electrophysiology Office Follow up Visit Note:    Date:  11/10/2023   ID:  Kelly [name], DOB 01-30-1981, MRN [id number]  PCP:  Monique Ano, MD  [facility] Cardiologist:  Boyce Byes, MD  [facility] Electrophysiologist:  Boyce Byes, MD    Interval History:     Kelly [name] is a 43 y.o. female who presents for a follow up visit.   I last saw the patient Nov 25, 2018 for.  She had a prior catheter ablation for atrial fibrillation in March 2024.  When I last saw her in May 2024 she was doing well without recurrence.  She was on Eliquis  for stroke prophylaxis.  We planned to stop Eliquis  after 6 months and start aspirin  81 given the low CHA2DS2-VASc score.  She was using a wearable device to monitor her heart rhythms.  She confirms the above.  No recurrence of arrhythmia.  She uses a wearable device to monitor her heart rhythms.  She takes aspirin  81 mg by mouth once daily without bleeding issues.      Past medical, surgical, social and family history were reviewed.  ROS:   Please see the history of present illness.    All other systems reviewed and are negative.  EKGs/Labs/Other Studies Reviewed:    The following studies were reviewed today:     EKG Interpretation Date/Time:  Wednesday Nov 10 2023 09:32:32 EDT Ventricular Rate:  74 PR Interval:  148 QRS Duration:  88 QT Interval:  382 QTC Calculation: 424 R Axis:   54  Text Interpretation: Normal sinus rhythm Normal ECG Confirmed by Harvie Liner [phone]) on 11/10/2023 9:38:16 AM    Physical Exam:    VS:  BP 120/84 (BP Location: Left Arm, Patient Position: Sitting, Cuff Size: Large)   Pulse 74   Ht 5\' 10"  (1.778 m)   Wt 251 lb (113.9 kg)   SpO2 97%   BMI 36.01 kg/m     Wt Readings from Last 3 Encounters:  11/10/23 251 lb (113.9 kg)  11/25/22 219 lb 2 oz (99.4 kg)  10/26/22 220 lb (99.8 kg)     GEN: no distress CARD: RRR, No MRG RESP: No IWOB. CTAB.       ASSESSMENT:    1. Paroxysmal atrial fibrillation (HCC)   2. Primary hypertension    PLAN:    In order of problems listed above:  #Atrial fibrillation Doing well after 2024 catheter ablation.  On aspirin  for stroke prophylaxis Continue metoprolol  as needed  #Hypertension At goal today.  Recommend checking blood pressures 1-2 times per week at home and recording the values.  Recommend bringing these recordings to the primary care physician.  Follow-up with EP on an as-needed basis   Signed, Harvie Liner, MD, [facility], [facility] 11/10/2023 9:38 AM    Electrophysiology [facility]

## 2023-11-10 ENCOUNTER — Ambulatory Visit: Attending: Cardiology | Admitting: Cardiology

## 2023-11-10 ENCOUNTER — Encounter: Payer: Self-pay | Admitting: Cardiology

## 2023-11-10 VITALS — BP 120/84 | HR 74 | Ht 70.0 in | Wt 251.0 lb

## 2023-11-10 DIAGNOSIS — I480 Paroxysmal atrial fibrillation: Secondary | ICD-10-CM | POA: Diagnosis not present

## 2023-11-10 DIAGNOSIS — I10 Essential (primary) hypertension: Secondary | ICD-10-CM

## 2023-11-10 NOTE — Patient Instructions (Signed)

## 2023-12-02 ENCOUNTER — Other Ambulatory Visit: Payer: Self-pay | Admitting: Cardiology

## 2023-12-07 ENCOUNTER — Encounter: Payer: Self-pay | Admitting: Cardiology

## 2023-12-07 MED ORDER — POTASSIUM CHLORIDE CRYS ER 20 MEQ PO TBCR: 20.0000 meq | EXTENDED_RELEASE_TABLET | Freq: Every day | ORAL | 0 refills | Status: AC

## 2024-02-08 NOTE — Progress Notes (Signed)
 This encounter was created in error - please disregard.

## 2024-04-10 ENCOUNTER — Other Ambulatory Visit: Payer: Self-pay | Admitting: Obstetrics and Gynecology

## 2024-04-10 DIAGNOSIS — Z1231 Encounter for screening mammogram for malignant neoplasm of breast: Secondary | ICD-10-CM

## 2024-05-11 ENCOUNTER — Ambulatory Visit
Admission: RE | Admit: 2024-05-11 | Discharge: 2024-05-11 | Disposition: A | Discharge status: Home / Self Care | Source: Ambulatory Visit | Attending: Obstetrics and Gynecology | Admitting: Obstetrics and Gynecology

## 2024-05-11 DIAGNOSIS — Z1231 Encounter for screening mammogram for malignant neoplasm of breast: Secondary | ICD-10-CM | POA: Diagnosis present
